# Patient Record
Sex: Male | Born: 1945 | Race: White | Hispanic: No | Marital: Single | Smoking: Never smoker
Health system: Southern US, Community
[De-identification: ages and names within clinical notes are randomized; demographics above are authoritative.]

## PROBLEM LIST (undated history)

## (undated) DIAGNOSIS — E7800 Pure hypercholesterolemia, unspecified: Secondary | ICD-10-CM

## (undated) DIAGNOSIS — I10 Essential (primary) hypertension: Secondary | ICD-10-CM

## (undated) DIAGNOSIS — E785 Hyperlipidemia, unspecified: Secondary | ICD-10-CM

## (undated) DIAGNOSIS — L570 Actinic keratosis: Secondary | ICD-10-CM

## (undated) HISTORY — PX: ABDOMINAL SURGERY: SHX537

## (undated) HISTORY — DX: Actinic keratosis: L57.0

---

## 2012-12-25 LAB — COMPREHENSIVE METABOLIC PANEL
Albumin: 3.6 g/dL (ref 3.4–5.0)
Alkaline Phosphatase: 96 U/L (ref 50–136)
Anion Gap: 7 (ref 7–16)
BUN: 21 mg/dL — ABNORMAL HIGH (ref 7–18)
Bilirubin,Total: 0.4 mg/dL (ref 0.2–1.0)
Calcium, Total: 8.8 mg/dL (ref 8.5–10.1)
Chloride: 106 mmol/L (ref 98–107)
Co2: 25 mmol/L (ref 21–32)
Creatinine: 1.12 mg/dL (ref 0.60–1.30)
EGFR (African American): >60
EGFR (Non-African Amer.): >60
Glucose: 159 mg/dL — ABNORMAL HIGH (ref 65–99)
Osmolality: 282 (ref 275–301)
Potassium: 3.6 mmol/L (ref 3.5–5.1)
SGOT(AST): 23 U/L (ref 15–37)
SGPT (ALT): 23 U/L (ref 12–78)
Sodium: 138 mmol/L (ref 136–145)
Total Protein: 7.1 g/dL (ref 6.4–8.2)

## 2012-12-25 LAB — CBC WITH DIFFERENTIAL/PLATELET
Basophil #: 0 10*3/uL (ref 0.0–0.1)
Basophil %: 0.3 %
Eosinophil #: 0.1 10*3/uL (ref 0.0–0.7)
Eosinophil %: 0.9 %
HCT: 37.9 % — ABNORMAL LOW (ref 40.0–52.0)
HGB: 13.3 g/dL (ref 13.0–18.0)
Lymphocyte #: 2.7 10*3/uL (ref 1.0–3.6)
Lymphocyte %: 24.7 %
MCH: 31.2 pg (ref 26.0–34.0)
MCHC: 35 g/dL (ref 32.0–36.0)
MCV: 89 fL (ref 80–100)
Monocyte #: 0.8 x10 3/mm (ref 0.2–1.0)
Monocyte %: 7 %
Neutrophil #: 7.3 10*3/uL — ABNORMAL HIGH (ref 1.4–6.5)
Neutrophil %: 67.1 %
Platelet: 274 10*3/uL (ref 150–440)
RBC: 4.24 10*6/uL — ABNORMAL LOW (ref 4.40–5.90)
RDW: 12.7 % (ref 11.5–14.5)
WBC: 10.9 10*3/uL — ABNORMAL HIGH (ref 3.8–10.6)

## 2012-12-25 LAB — CK TOTAL AND CKMB (NOT AT ARMC)
CK, Total: 77 U/L (ref 35–232)
CK-MB: 0.6 ng/mL (ref 0.5–3.6)

## 2012-12-25 LAB — LIPASE, BLOOD: Lipase: 166 U/L (ref 73–393)

## 2012-12-25 LAB — PROTIME-INR
INR: 1
Prothrombin Time: 13.5 secs (ref 11.5–14.7)

## 2012-12-26 ENCOUNTER — Inpatient Hospital Stay: Payer: Self-pay | Admitting: Internal Medicine

## 2012-12-26 LAB — CBC WITH DIFFERENTIAL/PLATELET
Basophil #: 0 x10 3/mm 3
Basophil %: 0.1 %
Eosinophil #: 0 x10 3/mm 3
Eosinophil %: 0 %
HCT: 28.2 % — ABNORMAL LOW
HGB: 9.9 g/dL — ABNORMAL LOW
Lymphocyte %: 15.7 %
Lymphs Abs: 1.6 x10 3/mm 3
MCH: 31.2 pg
MCHC: 35 g/dL
MCV: 89 fL
Monocyte #: 0.7 "x10 3/mm "
Monocyte %: 6.9 %
Neutrophil #: 7.7 x10 3/mm 3 — ABNORMAL HIGH
Neutrophil %: 77.3 %
Platelet: 161 x10 3/mm 3
RBC: 3.17 x10 6/mm 3 — ABNORMAL LOW
RDW: 13.8 %
WBC: 9.9 x10 3/mm 3

## 2012-12-26 LAB — URINALYSIS, COMPLETE
Bacteria: NONE SEEN
Bilirubin,UR: NEGATIVE
Blood: NEGATIVE
Glucose,UR: NEGATIVE mg/dL (ref 0–75)
Leukocyte Esterase: NEGATIVE
Nitrite: NEGATIVE
Ph: 5 (ref 4.5–8.0)
Protein: NEGATIVE
RBC,UR: 1 /HPF (ref 0–5)
Specific Gravity: 1.039 (ref 1.003–1.030)
Squamous Epithelial: NONE SEEN
WBC UR: 1 /HPF (ref 0–5)

## 2012-12-26 LAB — BASIC METABOLIC PANEL
Anion Gap: 5 — ABNORMAL LOW (ref 7–16)
BUN: 21 mg/dL — ABNORMAL HIGH (ref 7–18)
Calcium, Total: 7.4 mg/dL — ABNORMAL LOW (ref 8.5–10.1)
Chloride: 113 mmol/L — ABNORMAL HIGH (ref 98–107)
Co2: 23 mmol/L (ref 21–32)
Creatinine: 0.9 mg/dL (ref 0.60–1.30)
EGFR (African American): >60
EGFR (Non-African Amer.): >60
Glucose: 134 mg/dL — ABNORMAL HIGH (ref 65–99)
Osmolality: 286 (ref 275–301)
Potassium: 3.9 mmol/L (ref 3.5–5.1)
Sodium: 141 mmol/L (ref 136–145)

## 2012-12-26 LAB — HEMOGLOBIN
HGB: 9.2 g/dL — ABNORMAL LOW (ref 13.0–18.0)
HGB: 9 g/dL — ABNORMAL LOW (ref 13.0–18.0)

## 2012-12-27 LAB — CBC WITH DIFFERENTIAL/PLATELET
Basophil #: 0 10*3/uL (ref 0.0–0.1)
Basophil #: 0 10*3/uL (ref 0.0–0.1)
Basophil %: 0.2 %
Basophil %: 0.3 %
Eosinophil #: 0.1 10*3/uL (ref 0.0–0.7)
Eosinophil #: 0.1 10*3/uL (ref 0.0–0.7)
Eosinophil %: 0.8 %
Eosinophil %: 1.2 %
HCT: 21.2 % — ABNORMAL LOW (ref 40.0–52.0)
HCT: 23.3 % — ABNORMAL LOW (ref 40.0–52.0)
HGB: 7.5 g/dL — ABNORMAL LOW (ref 13.0–18.0)
HGB: 8.3 g/dL — ABNORMAL LOW (ref 13.0–18.0)
Lymphocyte #: 1.3 10*3/uL (ref 1.0–3.6)
Lymphocyte #: 1.7 10*3/uL (ref 1.0–3.6)
Lymphocyte %: 16.5 %
Lymphocyte %: 20 %
MCH: 31 pg (ref 26.0–34.0)
MCH: 31.6 pg (ref 26.0–34.0)
MCHC: 35.2 g/dL (ref 32.0–36.0)
MCHC: 35.8 g/dL (ref 32.0–36.0)
MCV: 88 fL (ref 80–100)
MCV: 88 fL (ref 80–100)
Monocyte #: 0.6 x10 3/mm (ref 0.2–1.0)
Monocyte #: 0.6 x10 3/mm (ref 0.2–1.0)
Monocyte %: 8.1 %
Monocyte %: 7.6 %
Neutrophil #: 5.7 10*3/uL (ref 1.4–6.5)
Neutrophil #: 6 10*3/uL (ref 1.4–6.5)
Neutrophil %: 74.4 %
Neutrophil %: 70.9 %
Platelet: 140 10*3/uL — ABNORMAL LOW (ref 150–440)
Platelet: 140 10*3/uL — ABNORMAL LOW (ref 150–440)
RBC: 2.41 10*6/uL — ABNORMAL LOW (ref 4.40–5.90)
RBC: 2.64 10*6/uL — ABNORMAL LOW (ref 4.40–5.90)
RDW: 13.5 % (ref 11.5–14.5)
RDW: 13.4 % (ref 11.5–14.5)
WBC: 7.6 10*3/uL (ref 3.8–10.6)
WBC: 8.5 10*3/uL (ref 3.8–10.6)

## 2012-12-27 LAB — BASIC METABOLIC PANEL
Anion Gap: 5 — ABNORMAL LOW (ref 7–16)
BUN: 11 mg/dL (ref 7–18)
Calcium, Total: 7.9 mg/dL — ABNORMAL LOW (ref 8.5–10.1)
Chloride: 113 mmol/L — ABNORMAL HIGH (ref 98–107)
Co2: 27 mmol/L (ref 21–32)
Creatinine: 0.81 mg/dL (ref 0.60–1.30)
EGFR (African American): >60
EGFR (Non-African Amer.): >60
Glucose: 109 mg/dL — ABNORMAL HIGH (ref 65–99)
Osmolality: 289 (ref 275–301)
Potassium: 3.4 mmol/L — ABNORMAL LOW (ref 3.5–5.1)
Sodium: 145 mmol/L (ref 136–145)

## 2012-12-28 LAB — CBC WITH DIFFERENTIAL/PLATELET
Basophil #: 0 10*3/uL (ref 0.0–0.1)
Basophil #: 0 10*3/uL (ref 0.0–0.1)
Basophil %: 0.5 %
Basophil %: 0.4 %
Eosinophil #: 0.1 10*3/uL (ref 0.0–0.7)
Eosinophil #: 0.1 10*3/uL (ref 0.0–0.7)
Eosinophil %: 1.6 %
Eosinophil %: 2 %
HCT: 22.7 % — ABNORMAL LOW (ref 40.0–52.0)
HCT: 22.4 % — ABNORMAL LOW (ref 40.0–52.0)
HGB: 8.1 g/dL — ABNORMAL LOW (ref 13.0–18.0)
HGB: 8 g/dL — ABNORMAL LOW (ref 13.0–18.0)
Lymphocyte #: 1.3 10*3/uL (ref 1.0–3.6)
Lymphocyte #: 1.3 10*3/uL (ref 1.0–3.6)
Lymphocyte %: 20.3 %
Lymphocyte %: 19.1 %
MCH: 31.5 pg (ref 26.0–34.0)
MCH: 31.6 pg (ref 26.0–34.0)
MCHC: 35.6 g/dL (ref 32.0–36.0)
MCHC: 35.7 g/dL (ref 32.0–36.0)
MCV: 88 fL (ref 80–100)
MCV: 89 fL (ref 80–100)
Monocyte #: 0.6 x10 3/mm (ref 0.2–1.0)
Monocyte #: 0.6 x10 3/mm (ref 0.2–1.0)
Monocyte %: 8.8 %
Monocyte %: 8.5 %
Neutrophil #: 4.5 10*3/uL (ref 1.4–6.5)
Neutrophil #: 4.9 10*3/uL (ref 1.4–6.5)
Neutrophil %: 68.8 %
Neutrophil %: 70 %
Platelet: 150 10*3/uL (ref 150–440)
Platelet: 160 10*3/uL (ref 150–440)
RBC: 2.56 10*6/uL — ABNORMAL LOW (ref 4.40–5.90)
RBC: 2.53 10*6/uL — ABNORMAL LOW (ref 4.40–5.90)
RDW: 13.3 % (ref 11.5–14.5)
RDW: 13.6 % (ref 11.5–14.5)
WBC: 6.5 10*3/uL (ref 3.8–10.6)
WBC: 7 10*3/uL (ref 3.8–10.6)

## 2012-12-28 LAB — HEMOGLOBIN: HGB: 8.2 g/dL — ABNORMAL LOW (ref 13.0–18.0)

## 2012-12-30 LAB — COMPREHENSIVE METABOLIC PANEL
Albumin: 3.3 g/dL — ABNORMAL LOW (ref 3.4–5.0)
Alkaline Phosphatase: 88 U/L (ref 50–136)
Anion Gap: 5 — ABNORMAL LOW (ref 7–16)
BUN: 21 mg/dL — ABNORMAL HIGH (ref 7–18)
Bilirubin,Total: 0.3 mg/dL (ref 0.2–1.0)
Calcium, Total: 8.8 mg/dL (ref 8.5–10.1)
Chloride: 107 mmol/L (ref 98–107)
Co2: 28 mmol/L (ref 21–32)
Creatinine: 1 mg/dL (ref 0.60–1.30)
EGFR (African American): >60
EGFR (Non-African Amer.): >60
Glucose: 179 mg/dL — ABNORMAL HIGH (ref 65–99)
Osmolality: 287 (ref 275–301)
Potassium: 3.3 mmol/L — ABNORMAL LOW (ref 3.5–5.1)
SGOT(AST): 23 U/L (ref 15–37)
SGPT (ALT): 29 U/L (ref 12–78)
Sodium: 140 mmol/L (ref 136–145)
Total Protein: 6.5 g/dL (ref 6.4–8.2)

## 2012-12-30 LAB — CBC WITH DIFFERENTIAL/PLATELET
Basophil #: 0 10*3/uL (ref 0.0–0.1)
Basophil %: 0.5 %
Eosinophil #: 0.1 10*3/uL (ref 0.0–0.7)
Eosinophil %: 2 %
HCT: 24.1 % — ABNORMAL LOW (ref 40.0–52.0)
HGB: 8.5 g/dL — ABNORMAL LOW (ref 13.0–18.0)
Lymphocyte #: 1.3 10*3/uL (ref 1.0–3.6)
Lymphocyte %: 21.1 %
MCH: 31.7 pg (ref 26.0–34.0)
MCHC: 35.1 g/dL (ref 32.0–36.0)
MCV: 90 fL (ref 80–100)
Monocyte #: 0.6 x10 3/mm (ref 0.2–1.0)
Monocyte %: 9 %
Neutrophil #: 4.2 10*3/uL (ref 1.4–6.5)
Neutrophil %: 67.4 %
Platelet: 281 10*3/uL (ref 150–440)
RBC: 2.67 10*6/uL — ABNORMAL LOW (ref 4.40–5.90)
RDW: 13.4 % (ref 11.5–14.5)
WBC: 6.2 10*3/uL (ref 3.8–10.6)

## 2012-12-30 LAB — URINALYSIS, COMPLETE
Bacteria: NONE SEEN
Bilirubin,UR: NEGATIVE
Blood: NEGATIVE
Glucose,UR: NEGATIVE mg/dL (ref 0–75)
Hyaline Cast: 2
Ketone: NEGATIVE
Leukocyte Esterase: NEGATIVE
Nitrite: NEGATIVE
Ph: 5 (ref 4.5–8.0)
Protein: 30
RBC,UR: 4 /HPF (ref 0–5)
Specific Gravity: 1.027 (ref 1.003–1.030)
Squamous Epithelial: <1
WBC UR: 3 /HPF (ref 0–5)

## 2012-12-30 LAB — CBC
HCT: 23.9 % — ABNORMAL LOW (ref 40.0–52.0)
HGB: 8.3 g/dL — ABNORMAL LOW (ref 13.0–18.0)
MCH: 31.4 pg (ref 26.0–34.0)
MCHC: 34.9 g/dL (ref 32.0–36.0)
MCV: 90 fL (ref 80–100)
Platelet: 265 10*3/uL (ref 150–440)
RBC: 2.65 10*6/uL — ABNORMAL LOW (ref 4.40–5.90)
RDW: 13.6 % (ref 11.5–14.5)
WBC: 5.7 10*3/uL (ref 3.8–10.6)

## 2012-12-30 LAB — PROTIME-INR
INR: 1.1
Prothrombin Time: 13.9 secs (ref 11.5–14.7)

## 2012-12-31 ENCOUNTER — Observation Stay: Payer: Self-pay | Admitting: Student

## 2012-12-31 LAB — HEMOGLOBIN
HGB: 7.9 g/dL — ABNORMAL LOW (ref 13.0–18.0)
HGB: 8 g/dL — ABNORMAL LOW (ref 13.0–18.0)
HGB: 8.7 g/dL — ABNORMAL LOW (ref 13.0–18.0)

## 2013-01-01 LAB — HEMOGLOBIN
HGB: 8.5 g/dL — ABNORMAL LOW (ref 13.0–18.0)
HGB: 8.5 g/dL — ABNORMAL LOW (ref 13.0–18.0)

## 2014-03-27 ENCOUNTER — Ambulatory Visit: Payer: Self-pay | Admitting: Family Medicine

## 2014-06-02 NOTE — Consult Note (Signed)
Chief Complaint:  Subjective/Chief Complaint please see full GI consult and brief consult note. 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  Patietn had a bm wednesday follow2ed by another that was bloody, not nearly as much as that noted on the initial episode last week.  No abdominal pain, no n/v no hematemesis.  No repeat bm since wednesday evening.  Currently on clear liquids, tolerating these.  Denies any previous h/o pudz or other rectal bleeding.  Hemodynamically stable, hgb done shortly ago 8.7, increasing.  Recommend continue clears for now, if stable without bleeding overnight advance to full liquid tomorrow am, continue that for several days before low residue for about a week.  There is a possibility that the bleeding yesterday was actually from anal outlet, eg. internal hemorrhoid rather than recurrent of original site.  HGB stable from d/c value.  Will follow.   VITAL SIGNS/ANCILLARY NOTES: **Vital Signs.:   21-Nov-14 13:38  Vital Signs Type Routine  Celsius 36.3  Temperature Source oral  Pulse Pulse 64  Respirations Respirations 18  Systolic BP Systolic BP 235  Diastolic BP (mmHg) Diastolic BP (mmHg) 74  Mean BP 89  Pulse Ox % Pulse Ox % 100  Pulse Ox Activity Level  At rest  Oxygen Delivery Room Air/ 21 %   Brief Assessment:  Cardiac Regular   Respiratory clear BS   Gastrointestinal details normal Soft  Nontender  Nondistended  No masses palpable  Bowel sounds normal   Lab Results: Routine Hem:  20-Nov-14 15:09   Hemoglobin (CBC)  8.3    20:28   Hemoglobin (CBC)  8.5  21-Nov-14 04:56   Hemoglobin (CBC)  7.9 (Result(s) reported on 31 Dec 2012 at 05:29AM.)    10:22   Hemoglobin (CBC)  8.0 (Result(s) reported on 31 Dec 2012 at 10:33AM.)    17:55   Hemoglobin (CBC)  8.7 (Result(s) reported on 31 Dec 2012 at 06:16PM.)   Assessment/Plan:  Assessment/Plan:  Assessment 1) as noted above.  following.   Electronic Signatures: Loistine Simas (MD)  (Signed 21-Nov-14 19:14)  Authored: Chief Complaint, VITAL SIGNS/ANCILLARY NOTES, Brief Assessment, Lab Results, Assessment/Plan   Last Updated: 21-Nov-14 19:14 by Loistine Simas (MD)

## 2014-06-02 NOTE — H&P (Signed)
PATIENT NAME:  Cody Herrera, Cody Herrera MR#:  [id number] DATE OF BIRTH:  1945-07-02  DATE OF ADMISSION:  12/30/2012   REFERRING PHYSICIAN: Dr. Cheri Guppy.   PRIMARY CARE PHYSICIAN: Dr. Jeananne Rama.   CHIEF COMPLAINT: Bright red blood per rectum.   HISTORY OF PRESENT ILLNESS: This is a 69 year old Caucasian gentleman with past medical history of hypertension, coronary artery disease status post PCI and stenting back in 2008, as well as a recent GI bleed, who is presenting with bright red blood per rectum. The patient states earlier this morning he had a normal bowel movement followed by later in the day, a bowel movement which he described as dark stool followed by bright red blood on the tissue paper after wiping. This is the only episode today. He denies any associated chest pain, palpitations, lightheadedness, shortness of breath. Given the concern of his recent GI bleed requiring 3 units of packed red blood cell transfusion and SMA embolization, he presented to the Emergency Department for further workup and evaluation. Thus far, in the Emergency Department, he has had no further episodes of bleeding. His hemoglobin and hematocrit have been stable as well as his vital signs.  On rectal examination, he is having bright red blood. Currently, he is without complaints.    REVIEW OF SYSTEMS:  CONSTITUTIONAL: Denies fever, fatigue, weakness, pain.  EYES: Denies blurred vision, double vision, eye pain.  EARS, NOSE, THROAT: Denies tinnitus, ear pain, hearing loss.  RESPIRATORY: Denies cough, wheeze, shortness of breath. CARDIOVASCULAR: Denies chest pain, palpitations, edema.  GASTROINTESTINAL: Denies nausea, vomiting, diarrhea, abdominal pain. Positive for bright red blood per rectum.  GENITOURINARY: Denies dysuria, hematuria.  ENDOCRINE: Denies nocturia or polyuria.  HEMATOLOGIC AND LYMPHATIC:  Denies easy bruising or bleeding, aside from rectal bleeding as mentioned above.  SKIN: Denies any rash or lesion.    MUSCULOSKELETAL: Denies any pain in neck, back, shoulder, knees, hips.  NEUROLOGIC: Denies any paralysis, paresthesias.  PSYCHIATRIC: Denies any anxiety or depressive symptoms.  Otherwise, full review of systems performed by me is negative.   PAST MEDICAL HISTORY: Hypertension, coronary artery disease status post PCI and stenting back in 2008, hyperlipidemia, recent GI bleed which required embolization of the SMA. He required 3 units packed red blood cell transfusion during his recent hospital stay.   SOCIAL HISTORY: Denies alcohol, tobacco or drug usage.   FAMILY HISTORY: Positive for hypertension and coronary artery disease.   ALLERGIES: No known drug allergies.   HOME MEDICATIONS: Include atenolol 100 mg p.o. at bedtime, gemfibrozil 600 mg p.o. b.i.d., lisinopril 10 mg p.o. at bedtime, simvastatin 40 mg p.o. at bedtime.   PHYSICAL EXAMINATION: VITAL SIGNS: Temperature 97, heart rate 96, respirations 16, blood pressure 137/76, saturating 100% on room air. Weight 86.2 kg, BMI 28.9.  GENERAL: Well-nourished, well-developed Caucasian gentleman, who is currently in no acute distress.  HEAD: Normocephalic, atraumatic.  EYES:  Pupils equal, round and reactive to light. Extraocular muscles intact. No scleral icterus.  MOUTH: Moist mucous membranes. Dentition intact. No abscess noted.  EARS, NOSE AND THROAT: Throat clear without exudates. No external lesions.  NECK: Supple. No thyromegaly. No nodules. No JVD.  PULMONARY: Clear to auscultation bilaterally. No wheezes, rubs or rhonchi, no use of accessory muscles.  CHEST: Nontender to palpation. Good respiratory effort.  CARDIOVASCULAR: S1, S2, regular rate and rhythm. No murmurs, rubs or gallops. No edema. Pedal pulses 2+ bilaterally.  GASTROINTESTINAL: Soft, nontender, nondistended. No masses. Positive bowel sounds. No hepatosplenomegaly. Rectal exam performed by the Emergency Department revealed frank  blood.  MUSCULOSKELETAL: No swelling,  clubbing or edema. Range of motion full in all extremities.   NEUROLOGIC: Cranial nerves II through XII intact. No gross neurological deficits. Sensation intact. Reflexes intact.  SKIN: No ulcerations, lesions, rashes, cyanosis. Skin warm, dry, turgor is intact. PSYCHIATRIC: Mood and affect; the patient appears anxious and agitated. He is awake, alert, oriented x 3. Insight and judgment intact.   LABORATORY DATA: Sodium 140, potassium 3.3, chloride 107, bicarb 28, BUN 21, creatinine of 1, glucose 179, total protein 6.5, albumin 3.3. Remainder of LFTs within normal limits. WBC 6.2, hemoglobin 8.5, hematocrit 24.1. both of these numbers were actually increased from his discharge, platelets of 281, INR 1.1.   ASSESSMENT AND PLAN: A 69 year old Caucasian gentleman with past history of hypertension, coronary artery disease and recent gastrointestinal bleed requiring 3 units packed red blood cell transfusion, embolization of the SMA who is presenting with bright red blood per rectum. 1.  Bright red blood per rectum. He has recently had gastrointestinal bleed status post  embolization, has not yet restarted his aspirin and Plavix has been discontinued. He has had episode of bright red blood in the last 24 hours with a stable hemoglobin, stable hematocrit and vital signs. We will trend hemoglobin q.6 hours, transfuse as required to keep hemoglobin greater than 7. We will consult GI to see if any further interventions are required. 2.    Hypertension. Continue lisinopril and Atenolol.  3.  Coronary artery disease.  Status post stenting and congestive heart illness requires. She is status post stenting, status post PCI. Have yet to restart start aspirin. We will continue to hold for now. 4.   Hyperlipidemia. Continue Zocor and gemfibrozil.  5.   Deep venous prophylaxis, sequential compression devices only.  6.   CODE STATUS: The patient is full code.   TIME SPENT: 45 minutes.      ____________________________ Aaron Mose. Kasy Iannacone, MD dkh:NTS D: 12/30/2012 22:55:03 ET T: 12/30/2012 23:20:41 ET JOB#: [id number]  cc: Aaron Mose. Nekesha Font, MD, <Dictator> Keerthana Vanrossum Woodfin Ganja MD ELECTRONICALLY SIGNED 12/31/2012 23:43

## 2014-06-02 NOTE — Discharge Summary (Signed)
PATIENT NAME:  Cody Herrera, FOSDICK MR#:  [id number] DATE OF BIRTH:  Jul 29, 1945  DATE OF ADMISSION:  12/31/2012 DATE OF DISCHARGE:  01/01/2013  CONSULTANTS: Dr. Gustavo Lah from GI.   CHIEF COMPLAINT: Bright red blood per rectum.   DISCHARGE DIAGNOSES:  1.  Lower gastrointestinal bleed, possibly diverticular, resolved.  2,  Anemia suspected from gastrointestinal bleed.  3.  History of coronary artery disease, status post stenting in the past.  4 . Hypertension.  5.  Hyperlipidemia.  6.  History of recent hospitalization for diverticular, bleed status post embolization  requiring packed red blood cell transfusion.   DISCHARGE MEDICATIONS: Simvastatin 40 mg daily, atenolol 100 mg daily, lisinopril 10 mg daily, gemfibrozil 600 mg 2 times a day. You may resume your aspirin the next couple of days if no more bleeding.   DIET: Full liquid tomorrow and then GI soft diet with low residue after that.   FOLLOWUP: Please follow with Dr. Gustavo Lah within the next 1 to 2 weeks. Please follow with PCP within the next 1 to 2 weeks. Check CBC early next week.   DISPOSITION: Home.   SIGNIFICANT LABS AND IMAGING: Initial BUN 21, creatinine 1, potassium 3.3, hemoglobin 8.3. Hemoglobin was stable mostly in the 8s, last hemoglobin was 8.5, INR 1.1.   HISTORY OF PRESENT ILLNESS AND HOSPITAL COURSE: For full details of H and P, please see the dictation by Dr. Lavetta Nielsen on 11/20, but briefly this is a pleasant 69 year old with CAD, status post PCI in 2008 or so with history of recent gastrointestinal bleed, who underwent embolization. At that time, the patient required PRBC transfusion. He was discharged, but came back after having an episode of bright red blood per rectum without any chest pain, palpitations, or lightheadedness. He had no further no bleeding. He was seen by Dr. Gustavo Lah from GI. His hemoglobin was serially checked and it remained stable in the 8s. His diet was advanced. Per Dr. Gustavo Lah, he is to follow  with him in the next 1 to 2 weeks for outpatient followup. The patient was very eager to be discharged. At this point, he is tolerating diet and seems hemodynamically stable, and he will be discharged. His acute gastrointestinal bleed has resolved and his hemoglobin is about the same level as previous discharge.   TOTAL TIME SPENT: 35 minutes.  ____________________________ Vivien Presto, MD sa:aw D: 01/02/2013 08:23:19 ET T: 01/02/2013 08:28:40 ET JOB#: [id number]  cc: Vivien Presto, MD, <Dictator> Vivien Presto MD ELECTRONICALLY SIGNED 01/15/2013 19:11

## 2014-06-02 NOTE — Consult Note (Signed)
Brief Consult Note: Diagnosis: rectal bleeding.   Patient was seen by consultant.   Consult note dictated.   Comments: Appreciate consult of for 69 y/o caucasian man with history of recent hepatic flexure bleed earlier this week. Underwent successful embolization by Dr Delana Meyer. Was also evaluated by Dr Rayann Heman of GI - did seem to have some rectal bleeding after procedure and was to possibly have colonoscopy yesterday if it continued. However, patient was discharged on Wed. Evidentally was not having any further bleeding: until after he went to [employer] yesterday- had a loosish dark brown (but not black) stool with some brpr afterwards. Came to ED. Was found to have brpr on exam and subsequently admitted. Denies abdominal pain and all other GI complaints. He is very worried about the bleeding. On exam his abdomen is benign. Rectal exam revealed a large external hemorrhoid and soft areas in the anal ring consistent with internal hemorrhoids. There was a very slight amount of pink material on my gloved finger after exam.  His hgb has been stable and he is hemodynamically stable. Impression and plan: BRPR: may be anal outlet, to me this does not appear consistent with bleeding consistent from higher in the colon unless at rapid pace and then would expect his hgb to be dropping. It is Improving- only with trace pink material on exam. No bloody stools since yesterday. Hgb stable. Currently recommend CLIQ diet and continuing to follow hemoglobins overnight if patient will agree to stay overnight..  Electronic Signatures: Stephens November H (NP)  (Signed 21-Nov-14 13:58)  Authored: Brief Consult Note   Last Updated: 21-Nov-14 13:58 by Theodore Demark (NP)

## 2014-06-02 NOTE — Consult Note (Signed)
General Aspect GI bleed with hypotension   Present Illness 69 yo male who presented to the ER tonight after two hours of rectal bleeding.  In the ER he is reported to have have 1/2 dozen more bloody bowel movements and subsequently became hypotensive.  Bleeding scan was done which was strongly positive for a hepatic flexure bleed.  No past episodes of GI bleeding, no previous diagnosis of colon pathology.  No recent illness    No Known Allergies:   Case History:  Family History Non-Contributory   Social History negative tobacco, negative ETOH, negative Illicit drugs   Review of Systems:  Fever/Chills No   Cough No   Sputum No   Abdominal Pain No   Diarrhea Yes  bloody   Constipation No   Nausea/Vomiting Yes  mild nausea   Chest Pain No   Telemetry Reviewed NSR   Dysuria No   Physical Exam:  GEN well developed, well nourished, obese   HEENT pale conjunctivae, PERRL, hearing intact to voice, dry oral mucosa   NECK supple  trachea midline   RESP normal resp effort  no use of accessory muscles   CARD regular rate  no JVD   ABD soft  distended  hyperactive BS   EXTR negative cyanosis/clubbing, negative edema   SKIN normal to palpation, No rashes   NEURO cranial nerves intact, follows commands, motor/sensory function intact   PSYCH alert, A+O to time, place, person, good insight   Hepatic:  15-Nov-14 16:10   Bilirubin, Total 0.4  Alkaline Phosphatase 96  SGPT (ALT) 23  SGOT (AST) 23  Total Protein, Serum 7.1  Albumin, Serum 3.6  Routine BB:  15-Nov-14 16:40   ABO Group + Rh Type O Positive  Antibody Screen NEGATIVE (Result(s) reported on 25 Dec 2012 at 05:42PM.)  Crossmatch Unit 1 Issued  Crossmatch Unit 2 Issued (Result(s) reported on 25 Dec 2012 at 10:10PM.)  Routine Chem:  15-Nov-14 16:10   Glucose, Serum  159  BUN  21  Creatinine (comp) 1.12  Sodium, Serum 138  Potassium, Serum 3.6  Chloride, Serum 106  CO2, Serum 25  Calcium (Total),  Serum 8.8  Osmolality (calc) 282  eGFR (African American) >60  eGFR (Non-African American) >60 (eGFR values <57m/min/1.73 m2 may be an indication of chronic kidney disease (CKD). Calculated eGFR is useful in patients with stable renal function. The eGFR calculation will not be reliable in acutely ill patients when serum creatinine is changing rapidly. It is not useful in  patients on dialysis. The eGFR calculation may not be applicable to patients at the low and high extremes of body sizes, pregnant women, and vegetarians.)  Anion Gap 7  Lipase 166 (Result(s) reported on 25 Dec 2012 at 04:58PM.)  Cardiac:  15-Nov-14 16:10   CK, Total 77  CPK-MB, Serum 0.6 (Result(s) reported on 25 Dec 2012 at 04:58PM.)  Routine Coag:  15-Nov-14 16:10   Prothrombin 13.5  INR 1.0 (INR reference interval applies to patients on anticoagulant therapy. A single INR therapeutic range for coumarins is not optimal for all indications; however, the suggested range for most indications is 2.0 - 3.0. Exceptions to the INR Reference Range may include: Prosthetic heart valves, acute myocardial infarction, prevention of myocardial infarction, and combinations of aspirin and anticoagulant. The need for a higher or lower target INR must be assessed individually. Reference: The Pharmacology and Management of the Vitamin K  antagonists: the seventh ACCP Conference on Antithrombotic and Thrombolytic Therapy. CCWCBJ.6283Sept:126 (3suppl): 2N9146842 A  HCT value >55% may artifactually increase the PT.  In one study,  the increase was an average of 25%. Reference:  "Effect on Routine and Special Coagulation Testing Values of Citrate Anticoagulant Adjustment in Patients with High HCT Values." American Journal of Clinical Pathology 2006;126:400-405.)  Routine Hem:  15-Nov-14 16:10   WBC (CBC)  10.9  RBC (CBC)  4.24  Hemoglobin (CBC) 13.3  Hematocrit (CBC)  37.9  Platelet Count (CBC) 274  MCV 89  MCH 31.2  MCHC  35.0  RDW 12.7  Neutrophil % 67.1  Lymphocyte % 24.7  Monocyte % 7.0  Eosinophil % 0.9  Basophil % 0.3  Neutrophil #  7.3  Lymphocyte # 2.7  Monocyte # 0.8  Eosinophil # 0.1  Basophil # 0.0 (Result(s) reported on 25 Dec 2012 at 04:46PM.)   Nuclear Med:    15-Nov-14 21:30, GI Blood Loss Study - Nuc Med  GI Blood Loss Study - Nuc Med   REASON FOR EXAM:    acute GI bleed - large blood loss, syncope  COMMENTS:       PROCEDURE: NM  - NM GI BLOOD LOSS STUDY  - Dec 25 2012  9:30PM     CLINICAL DATA:  Bright red blood per rectum. , actively bleeding per  rectum.    EXAM:  NUCLEAR MEDICINE GASTROINTESTINAL BLEEDING SCAN    TECHNIQUE:  Sequential abdominal images were obtained following intravenous  administration of Tc-25mlabeled red blood cells.  COMPARISON:  None    RADIOPHARMACEUTICALS:  24.44m Tc-995m-vitro labeled red cells.    FINDINGS:  Almost immediately (within the 1st 10 min), there is evidence of  active bleeding in the right upper quadrant. This intraluminal tag  red blood cell bolus moves towards the right upper quadrant and a  large caliber lumen consistent with the transverse colon. The  patient was taken off the camera for bloody bowel movement. Imaging  was renewed and bleeding again initiated in the region of the  hepatic flexure and moved towards the splenic flexure.     IMPRESSION:  Active gastrointestinal bleeding localized to the hepatic flexure of  the transverse colon.    Findings conveyed toDAVID KAMINSKI on 12/25/2012  at22:00.      Electronically Signed    By: SteSuzy BouchardD.    On: 12/25/2012 22:02         Verified By: JOHHoward Pouch   Impression 1.  Acute GI bleed with strongly positive bleeding scan and subsequent hypotension          Medicine to admit          GI on consult states will plan colonoscopy down the road           will embolize tonight; risks benefits and alternative discussed with patient and daughter.   All questions answered.  Patietn agrees to proceed 2.  CAD s/p multiple coronary stents.            home meds per medical service            consider cardiology consult   Plan level 3   Electronic Signatures: SchHortencia PilarD)  (Signed [id number])  Authored: General Aspect/Present Illness, Allergies, History and Physical Exam, Labs, Radiology, Impression/Plan   Last Updated: 15-Nov-14 23:34 by SchHortencia PilarD)

## 2014-06-02 NOTE — Consult Note (Signed)
PATIENT NAME:  Cody Herrera, Cody Herrera MR#:  [id number] DATE OF BIRTH:  1945/11/15  DATE OF CONSULTATION:  12/26/2012  PRIMARY CARE PHYSICIAN: Dr. Golden Pop,  REFERRING PHYSICIAN: Dr. Carolan Clines PHYSICIAN: Arther Dames, M.D.   REASON FOR THE CONSULT: Lower gastrointestinal bleed.   HISTORY OF THE PRESENT ILLNESS: Cody Herrera is a 69 year old male with a history of coronary disease, status post multiple stents on Plavix, who presented to the Emergency Room yesterday afternoon for acute onset of rectal bleeding. He had multiple episodes of bright red blood per rectum. He describes this as a significant amount of blood. He then presented to the Emergency Room and continued to have several more episodes of hematochezia. He was noted to be hypotensive in the Emergency Room and received blood and fluid. He also went for a tagged RBC scan, which showed evidence of active bleeding in the hepatic flexure. Based on this, he then went for a successful angiography with embolization.   Since the procedure he has not had any further bleeding. He has remained hemodynamically stable. In addition, he has not had a bowel movement since the Emergency Room.   He denies any previous episodes of GI bleed. He does report a colonoscopy several years ago. He thinks this was done at [facility] but is not sure. Does not know the findings on that. He has never had an upper endoscopy that he is aware of. He is not having any trouble with chest pain or shortness of breath.   PAST MEDICAL HISTORY: 1. Hypertension.  2. Hyperlipidemia.  3. Coronary artery disease, status post stenting on Plavix.   PAST SURGICAL HISTORY: None.   ALLERGIES: NKDA.   HOME MEDICATIONS: The list is currently unknown, although it is known that he does take Plavix.   SOCIAL HISTORY: He denies tobacco, alcohol or recreational drugs.   FAMILY HISTORY: He denies any family history of colon cancer or other GI malignancy.   REVIEW OF  SYSTEMS:   CONSTITUTIONAL: No weight gain or weight loss.  No fever or chills. HEENT: No oral lesions or sore throat. No vision changes. GASTROINTESTINAL: See HPI.  HEME/LYMPH: No easy bruising or bleeding. CARDIOVASCULAR: No chest pain or dyspnea on exertion. GENITOURINARY: No hematuria. INTEGUMENTARY: No rashes or pruritus PSYCHIATRIC: No depression/anxiety.  ENDOCRINE: No heat/cold intolerance, no hair loss or skin changes. ALLERGIC/IMMUNOLOGIC: Negative for hives. RESPIRATORY: No cough, no shortness of breath.  MUSCULOSKELETAL: No joint swelling or muscle pain.  PHYSICAL EXAMINATION: VITAL SIGNS: Temperature is 98.7, pulse is 80. Respirations are 17. Blood pressure 137/70, pulse oximetry is 96% on room air.  GENERAL: Alert and oriented times 4.  No acute distress. Appears stated age. HEENT: Normocephalic/atraumatic. Extraocular movements are intact. Anicteric. NECK: Soft, supple. JVP appears normal. No adenopathy. CHEST: Clear to auscultation. No wheeze or crackle. Respirations unlabored. HEART: Regular. No murmur, rub, or gallop.  Normal S1 and S2. ABDOMEN: Soft, nontender, nondistended.  Normal active bowel sounds in all four quadrants.  No organomegaly. No masses EXTREMITIES: No swelling, well perfused. SKIN: No rash or lesion. Skin color, texture, turgor normal. NEUROLOGICAL: Grossly intact. PSYCHIATRIC: Normal tone and affect. MUSCULOSKELETAL: No joint swelling or erythema.   LABORATORY DATA: His sodium is under 41, potassium 3.9, BUN 21, creatinine 0.9, lipase is normal. Liver enzymes are normal, albumin 3.6. Cardiac enzymes normal. White count 9.9. Current hemoglobin is 9.9, which is down from 13.3 yesterday. His platelets are 161. His INR is 1.0.  For the results of the tag red blood cell  scan in angiography. Please see the history of present illness.   ASSESSMENT AND PLAN: Lower gastrointestinal bleed: He did have a positive tag scan and underwent successful angiography  with embolization. Most likely this was a diverticular bleed but the etiology is unclear at this point.   He seems that he has not had any further bleeding. No bowel movements and hemodynamic changes.   PLAN:  1. He should have a colonoscopy as an outpatient in approximately six weeks to rule out another cause of the GI bleeding such as malignancy. We will set this up for Cody Herrera.  2. He is currently doing well. Even though the Plavix was stopped it is certainly still active in his system. Therefore, since he is doing okay with Plavix in his system I do not see a problem with restarting his Plavix. I would wait until another 24 hours to make sure his hemoglobin is stable and that he does not have any further bleeding. At that point it should be fine to restart his Plavix.   Thank you for this consult.   ____________________________ Arther Dames, MD mr:sg D: 12/26/2012 12:27:59 ET T: 12/26/2012 14:08:59 ET JOB#: [id number]  cc: Arther Dames, MD, <Dictator>  Mellody Life MD ELECTRONICALLY SIGNED 12/26/2012 19:47

## 2014-06-02 NOTE — Op Note (Signed)
PATIENT NAME:  Cody Herrera, Cody Herrera MR#:  [id number] DATE OF BIRTH:  20-Aug-1945  DATE OF PROCEDURE:  12/26/2012  PREOPERATIVE DIAGNOSIS: Gastrointestinal bleed.   POSTOPERATIVE DIAGNOSIS:  Gastrointestinal bleed.   PROCEDURE PERFORMED:   1.  Selective injection of the superior mesenteric artery.  2.  Embolization of mesenteric branches using 300 to 500 micron PVC beads.   SURGEON: Hortencia Pilar, M.D.   SEDATION: Versed 5 mg, plus fentanyl 200 mcg administered IV. Continuous ECG, pulse oximetry and cardiopulmonary monitoring was performed throughout the entire procedure by the interventional radiology nurse. Total sedation time was 1 hour/20 minutes.   ACCESS:  A 5 French sheath, right common femoral artery.   FLUOROSCOPY TIME: 25 minutes.   CONTRAST USED: Isovue 120 mL.   INDICATIONS: Cody Herrera is a 69 year old gentleman who presented to the Emergency Room with GI bleed. While in the Emergency Room, he had multiple bowel movements, became hypotensive. I have been contacted regarding embolization to stop the life-threatening hemorrhage. The patient underwent a bleeding scan which was positive. The risks and benefits, as well as the current therapies were reviewed. Complications including hematoma at the groin site, contrast reactions and nephrotoxicity of the contrast, as well as potential damage and necrosis of colon were all reviewed. All questions were answered. The patient is in agreement with proceeding emergently.   DESCRIPTION OF PROCEDURE: The patient is taken to special procedures and placed in the supine position. After adequate sedation was achieved, both groins are prepped and draped in sterile fashion. Ultrasound was placed in a sterile sleeve. Ultrasound is utilized secondary to lack of appropriate landmarks and to avoid vascular injury. Under direct ultrasound visualization, the common femoral artery is identified. It is echolucent and pulsatile, indicating patency. Image  is recorded for the permanent record, and access is obtained with a micropuncture needle under direct visualization. Microwire followed by micro sheath, J-wire followed by a 5 French sheath, 5 French pigtail catheter. The pigtail catheter is placed at T12 level and AP projection is obtained. This localizes the SMA and a lateral view was then obtained. Once the lateral view is reviewed, origin of the SMA is identified. It comes off at a very sharp angle, instead of slowly angling down as is normal. It comes straight off and then has several sharp angles. This made it somewhat difficult to negotiate with the wire and the catheter. However, once the catheter was seated into the SMA, selective imaging of several branches was obtained. When a blush was identified 300 to 500 micron beads were  infused. total of 3 mL was injected into several different branches. After each injection, contrast was reinjected to demonstrate that there was adequate persistent flow. Catheter was subsequently removed and oblique view of the right groin was obtained and a StarClose device deployed. There were no immediate complications.   INTERPRETATION: The patient's SMA is widely patent. It is somewhat difficult to negotiate with catheters and wires, as it has very sharp angles and, in fact, several of the branches had almost 360 degree loop or  very sharp hairpin turns. Ultimately, however, a successful embolization was achieved.     ____________________________ Katha Cabal, MD ggs:NTS D: 12/26/2012 01:17:18 ET T: 12/26/2012 01:48:33 ET JOB#: [id number]  cc: Katha Cabal, MD, <Dictator> Katha Cabal MD ELECTRONICALLY SIGNED 01/17/2013 17:16

## 2014-06-02 NOTE — Consult Note (Signed)
Brief Consult Note: Diagnosis: Pt admitted with gi bleed on asa and clopidigrel.   Patient was seen by consultant.   Comments: Pt with history of cad s/p pci of al1 with  taxus stent in 2008, and Xience stent in 2011 at [facility]. Has been on clopidigrel since then. Had gi bleed. asa and clopidigrel was stopped. Pt would like to remain off of clopidigrel if possible. Based on guidelines, pt may discontinue clopidigrel. Would remain on asa 81 mg daily; atenolol and lisinoprel as well as simvastatin.  Electronic Signatures: Teodoro Spray (MD)  (Signed [phone] 17:01)  Authored: Brief Consult Note   Last Updated: 17-Nov-14 17:01 by Teodoro Spray (MD)

## 2014-06-02 NOTE — Consult Note (Signed)
Chief Complaint:  Subjective/Chief Complaint denies abdominal pain or nausea, tolerating full liquid diet.  one bm this am , rust brown color.   VITAL SIGNS/ANCILLARY NOTES: **Vital Signs.:   22-Nov-14 05:42  Vital Signs Type Routine  Temperature Temperature (F) 98.7  Celsius 37  Pulse Pulse 61  Respirations Respirations 18  Systolic BP Systolic BP 97  Diastolic BP (mmHg) Diastolic BP (mmHg) 57  Mean BP 70  Systolic BP Systolic BP 97  Diastolic BP (mmHg) Diastolic BP (mmHg) 57  Pulse Ox % Pulse Ox % 97  Pulse Ox Activity Level  At rest  Oxygen Delivery Room Air/ 21 %    05:44  Pulse Pulse 65  Systolic BP Systolic BP 594  Diastolic BP (mmHg) Diastolic BP (mmHg) 64    58:59  Pulse Pulse 76  Systolic BP Systolic BP 292  Diastolic BP (mmHg) Diastolic BP (mmHg) 61  *Intake and Output.:   22-Nov-14 09:15  Stool  1, large and formed,   scant amount of blood noted in bottom of toilet   Brief Assessment:  Cardiac Regular   Respiratory clear BS   Gastrointestinal details normal Soft  Nontender  Nondistended  No masses palpable  Bowel sounds normal   Additional Physical Exam DRE-rust colored brown stool, soft no melena.   Lab Results: Routine Hem:  20-Nov-14 15:09   Hemoglobin (CBC)  8.3    20:28   Hemoglobin (CBC)  8.5  21-Nov-14 04:56   Hemoglobin (CBC)  7.9 (Result(s) reported on 31 Dec 2012 at 05:29AM.)    10:22   Hemoglobin (CBC)  8.0 (Result(s) reported on 31 Dec 2012 at 10:33AM.)    17:55   Hemoglobin (CBC)  8.7 (Result(s) reported on 31 Dec 2012 at 06:16PM.)  22-Nov-14 05:00   Hemoglobin (CBC)  8.5 (Result(s) reported on 01 Jan 2013 at 05:26AM.)   Assessment/Plan:  Assessment/Plan:  Assessment 1) hematochezia-stable hemodynamically and via serial labs. source likely diverticular, also s/p microembolization.   Plan 1) continue full liquid diet today and tomorrow, if no repeat melena/ active bleeding can switch to low residue to continue for a week.  2) in  regard to D/C, patietn wishes to go home as soon as possible.  would observe through late afternoon and if no changes, could consider d/c with close fu.  He should return if repeat episode.  Will recheck hgb at 2pm.   Electronic Signatures: Loistine Simas (MD)  (Signed [phone] 12:51)  Authored: Chief Complaint, VITAL SIGNS/ANCILLARY NOTES, Brief Assessment, Lab Results, Assessment/Plan   Last Updated: 22-Nov-14 12:51 by Loistine Simas (MD)

## 2014-06-02 NOTE — H&P (Signed)
PATIENT NAME:  Cody Herrera, Cody Herrera MR#:  [id number] DATE OF BIRTH:  March 26, 1945  DATE OF ADMISSION:  12/26/2012  PRIMARY CARE PHYSICIAN:  Dr. Golden Pop.   REFERRING PHYSICIAN:  Dr. Francene Castle.   CHIEF COMPLAINT:  Gastrointestinal bleed.   HISTORY OF PRESENT ILLNESS:  Mr. Wimer is a 69 year old pleasant white male with a past medical history of hypertension, hyperlipidemia, coronary artery disease, status post multiple stent placement, presented to the Emergency Department with complaints of GI bleed.  It started around 4:00 p.m.  Had 4 to 5 large bloody stools at home.  Concerning this, came to the Emergency Department.  After coming to the Emergency Department, the patient was profusely bleeding.  The patient had another 7 to 8 bowel movements while in the Emergency Department.  The patient became hypotensive.  Initial CBC was 13.3.  Repeat CBC is currently pending.  Coag profile is within normal limits.  The patient underwent tagged RBC scan, showed active GI bleeding localized to the hepatic flexure of the transverse colon.  Vascular surgery was consulted.  The patient underwent embolization with resolution of the bleeding.  The patient is currently admitted to the ICU.  Denies having any abdominal pain, nausea, vomiting.  Did not have any bowel movements in the last two hours.  The patient denies having any previous episodes of GI bleed.  The patient had a previous history of colonoscopy.   PAST MEDICAL HISTORY: 1.  Hypertension.  2.  Hyperlipidemia.  3.  Coronary artery disease, status post multiple stent placement.   PAST SURGICAL HISTORY:  None.   ALLERGIES:  No known drug allergies.   HOME MEDICATIONS:  Currently we do not have the list of medication.   SOCIAL HISTORY:  Denies smoking, drinking alcohol or using illicit drugs.  Retired, lives by himself.  Independent of ADLs and IADLs.   FAMILY HISTORY:  History of hypertension, hyperlipidemia, carotid artery disease.    REVIEW OF SYSTEMS: CONSTITUTIONAL:  Denies generalized weakness.  EYES:  No change in vision.  EARS, NOSE, THROAT:  No change in hearing.  RESPIRATORY:  No cough, shortness of breath.  CARDIOVASCULAR:  No chest pain, palpitations.  No pedal edema.  GASTROINTESTINAL:  Has GI bleed.  There is no abdominal pain, nausea, vomiting.  GENITOURINARY:  No dysuria or hematuria.  SKIN:  No rash or lesions.  HEMATOLOGIC:  No easy bruising or bleeding.  MUSCULOSKELETAL:  No joint pains and aches.  NEUROLOGIC:  No weakness or numbness in any part of the body.   PHYSICAL EXAMINATION: GENERAL:  This is a well-built, well-nourished, age-appropriate male lying down in the bed, not in distress.  VITAL SIGNS:  Temperature 98, pulse 84, blood pressure 96/61, respiratory rate of 16, oxygen saturation is 96% on room air.  HEENT:  Head normocephalic, atraumatic.  Eyes, no scleral icterus.  Conjunctivae normal.  Pupils equal and react to light.  Mucous membranes moist.  No pharyngeal erythema.  NECK:  Supple.  No lymphadenopathy.  No JVD.  No carotid bruit.  No thyromegaly.  CHEST:  Has no focal tenderness.  LUNGS:  Bilaterally clear to auscultation.  HEART:  S1 and S2 regular.  No murmurs are heard.  No pedal edema.  Pulses 2+.  ABDOMEN:  Bowel sounds plus.  Soft, nontender, nondistended.  Could not appreciate any hepatosplenomegaly.  SKIN:  No rash or lesions.  MUSCULOSKELETAL:  Good range of motion in all the extremities.   NEUROLOGIC:  The patient is alert, oriented to place,  person and time.  Cranial nerves II through XII intact.  Motor 5 by 5 in upper and lower extremities.   LABORATORY DATA:  Lipase is 166.  CMP is completely within normal limits.  CBC:  WBC of 10.9, hemoglobin 13.3 initially, at the time of the presentation, platelet count of 274.  Coag profile is well within normal limits.  The patient had tagged RBC scan which is positive for bleed in the transverse colon.   ASSESSMENT AND PLAN:   Mr. Beeks is a 69 year old male who comes to the Emergency Department with profuse gastrointestinal bleed.  1.  Gastrointestinal bleed, lower.  Continue with IV fluids, monitoring CBC q. 6h.  If the patient had any further episodes of bowel movements repeat the CBC.  Transfuse 2 units of packed RBC.  We will keep the patient nothing by mouth until we ensure hemoglobin is stabilized.  We will also have a close watch for any signs of abdominal pain. 2.  Hypotension.  Give 1 liter fluid for this and follow up.  3.  Hypertension.  We do not have a list of his medications, however will be held secondary to hypotension.  4.  History of coronary artery disease.  Holding all the medications for now.   5.  Keep the patient on DVT prophylaxis with SCDs.   TIME SPENT:  50 minutes.    ____________________________ Monica Becton, MD pv:ea D: 12/26/2012 03:31:52 ET T: 12/26/2012 03:55:49 ET JOB#: [id number]  cc: Monica Becton, MD, <Dictator> Guadalupe Maple, MD Monica Becton MD ELECTRONICALLY SIGNED 01/09/2013 0:38

## 2014-06-02 NOTE — Consult Note (Signed)
PATIENT NAME:  Cody Herrera, THAIN MR#:  [id number] DATE OF BIRTH:  [birth year]  DATE OF CONSULTATION:  12/31/2012  REFERRING PHYSICIAN:  Dr. Lavetta Nielsen CONSULTING PHYSICIAN:  Theodore Demark, NP  REASON FOR CONSULTATION:  GI consult was ordered by Dr. Lavetta Nielsen for evaluation of GI bleeding.   HISTORY OF PRESENT ILLNESS:  I appreciate consult for a 69 year old Caucasian man with history of recent hepatic flexure bleed earlier this week, underwent a successful embolization by Dr. Hinton Lovely, was also evaluated by Dr. Rayann Heman of GI, did seem to have some rectal bleeding after procedure and was to possibly have colonoscopy yesterday if it continued. However, the patient was discharged on Wednesday. Evidently he was not having any further rectal bleeding until after he went to [facility] yesterday, had a loose, dark brown, but not black, stool with some bright red-blood per rectum afterwards. He came to the ED, was found to have BRP on exam, was subsequently admitted. His hemoglobin has been stable and he has been hemodynamically stable.   REVIEW OF SYSTEMS:  Ten systems reviewed, unremarkable other than what is noted above.   PAST MEDICAL HISTORY:  Hypertension, CAD status post cardiac stent in 2008, hyperlipidemia. Did require 3 units of packed red blood cells earlier this week.   SOCIAL HISTORY:  No EtOH, tobacco, or drugs.   FAMILY HISTORY:  Significant for CAD, HTN.   ALLERGIES:  NKDA.  HOME MEDICATIONS: 1.  Atenolol 100 mg p.o. at bedtime.  2.  Gemfibrozil 600 mg b.i.d. 3.  Lisinopril 10 mg p.o. at bedtime.  4.  Simvastatin 40 mg p.o. at bedtime.   MOST RECENT LABS:  Glucose 179, BUN 21, creatinine 1.00, sodium 140, potassium 3.3, GFR greater than 60, calcium 8.8, total protein 6.5, albumin 3.3, ALP 88, total bilirubin 2.3, AST 23, ALT 29. WBC 5.7, hemoglobin has ranged from 8.3 to 8.5, 7.9 to currently 8, hematocrit 24.1. Red cells normocytic with normal RDW, platelets 281. PT 13.9, INR 1.1.    PHYSICAL EXAMINATION:  VITAL SIGNS:  Temp 98.1, pulse 75, respiratory rate 19, blood pressure 115/68.  GENERAL:  Well-appearing, well-nourished, Caucasian man in bed in no acute distress.  HEENT:  Normocephalic, atraumatic. Conjunctiva pink. Sclerae are clear.  NECK:  Supple. No JVD, lymphadenopathy, thyromegaly.  CHEST:  Respirations eupneic. Lungs clear.  CARDIAC:  S1, S2, RRR. No MRG. No edema. Peripheral pulses 2+.  GASTROINTESTINAL:  Flat abdomen, soft. Bowel sounds x 4. Nontender, nondistended. No guarding, rigidity, peritoneal signs, hepatosplenomegaly or other abnormalities.  RECTAL:  Large external hemorrhoid. Internal exam with some soft areas consistent with internal hemorrhoids. There was a very slight trace amount of pink material on my finger once the exam is done.  SKIN:  Warm, dry, pink. No erythema, lesion or rash.  EXTREMITIES:  MAEW x 4. Sensation intact.  NEUROLOGIC:  Alert, oriented x 3. Cranial nerves II through XII intact. Speech clear. No facial droop.   IMPRESSION AND PLAN:  Bright-red blood per rectum. May be anal outlet. To me this does not appear consistent with bleeding from higher in the colon unless at rapid paced and then would expect his hemoglobin to be dropping. The bleeding has been improving. There is only a trace pink material on exam presently. No bloody stools since yesterday. Hemoglobin stable currently. Recommend clear liquid diet and continue to follow his hemoglobins overnight if the patient will agree to this.   These services were provided by Stephens November, MSN, [facility], in collaboration with Hassell Done  Gustavo Lah, M.D., with whom this patient was discussed in full. Thank you very much for this consult.   ____________________________ Theodore Demark, NP chl:jm D: 12/31/2012 16:55:58 ET T: 12/31/2012 17:25:40 ET JOB#: [id number]  cc: Theodore Demark, NP, <Dictator> Media SIGNED 01/17/2013 13:06

## 2014-06-02 NOTE — Discharge Summary (Signed)
PATIENT NAME:  Cody Herrera, Cody Herrera MR#:  [id number] DATE OF BIRTH:  09/17/1945  DATE OF ADMISSION:  12/26/2012 DATE OF DISCHARGE:  12/29/2012  ADMISSION DIAGNOSES:  1.  Gastrointestinal bleed.  2.  Acute blood loss anemia.   DISCHARGE DIAGNOSES: 1.  Gastrointestinal bleed due to arterial bleed in hepatic flexure status post embolization of mesenteric artery. 2.  Acute blood loss anemia.  3.  Hemorrhagic shock. 4.  History of hypertension.  5.  History of coronary artery disease.  CONSULTATIONS: Dr. Delana Meyer, GI, and Dr. Ubaldo Glassing for cardiology.    PROCEDURE: On December 26, 2012, the patient underwent an embolization of mesenteric branches.   Discharge hemoglobin is 8.2.   HOSPITAL COURSE: This is a very pleasant 69 year old male who presented with bright red blood per rectum. Obtained a GI bleeding scan, which was positive for bleeding at the hepatic flexure. Underwent a stat embolization by vascular surgery and then was admitted to the hospitalist service. For further details, please refer to the H and P.  1.  GI bleed with positive bleeding initially on GI bleeding scan at the hepatic flexure. The patient underwent embolization by Dr. Delana Meyer. His hemoglobin has remained stable after his blood transfusions. Appreciate vascular surgery and GI consultation.  2.  Acute blood loss anemia, status post 3 units of PRBCs. His hemoglobin has remained stable.  3.  Hemorrhagic shock, improved with fluids and blood products. His blood pressure remained stable.  4.  Hypertension. The patient's blood pressure is now appropriate.  5.  History of CAD, status post PCI. The patient was on Plavix and aspirin. His stent was placed in 2008. Cardiology had seen the patient in consultation and recommended just aspirin and the patient does not need Plavix.   DISCHARGE MEDICATIONS: 1.  Atenolol 100 mg daily.  2.  Simvastatin 40 mg daily.  3.  Lisinopril 10 mg daily.  4.  Aspirin 81 mg daily.   DISCHARGE  DIET: Low-sodium, low-fat, low-cholesterol diet. Soft diet for a week.   The patient is medically stable for discharge.   TIME SPENT: Approximately 35 minutes.   ____________________________ Donell Beers. Benjie Karvonen, MD spm:jcm D: 12/29/2012 13:36:29 ET T: 12/29/2012 14:25:23 ET JOB#: [id number]  cc: Kaelum Kissick P. Benjie Karvonen, MD, <Dictator> Donell Beers Symphanie Cederberg MD ELECTRONICALLY SIGNED 12/30/2012 14:02

## 2014-06-02 NOTE — Consult Note (Signed)
   Present Illness 69 yo male with history of cad s/p pci of al1 in 2008 with a Taxus stent and a Xience stent in 2011 at [facility]. He has been on clopidigrel at 75 g since that time. He has done well with no evidence of cad. HE was admitted yesterday with gi bleed. He was taken off of asa and plavix and transfused. He is currently stable with no obvious bleeding. He has undergone embolization of hepatic flexure per vascular surgery. His hgb was down somewhat this am and he recieved further blood. No obvious sign of bleeding. He does not wish to go back on plavix. He is 3 years s/p his most recent stent and has no cardiac symtpoms. He has been on simvastatin, gemfibrizol, atenolol and lisinopril.   Physical Exam:  GEN well developed, no acute distress   HEENT PERRL   NECK supple  No masses   RESP normal resp effort   CARD Regular rate and rhythm  No murmur   ABD denies tenderness  normal BS  no Adominal Mass   LYMPH negative neck   EXTR negative cyanosis/clubbing, negative edema   SKIN normal to palpation   NEURO cranial nerves intact, motor/sensory function intact   PSYCH A+O to time, place, person   Review of Systems:  Subjective/Chief Complaint dark tarry stools   General: Fatigue   Skin: No Complaints   ENT: No Complaints   Eyes: No Complaints   Neck: No Complaints   Respiratory: No Complaints   Cardiovascular: No Complaints   Gastrointestinal: Rectal Bleeding  Black tarry stools   Genitourinary: No Complaints   Vascular: No Complaints   Musculoskeletal: No Complaints   Neurologic: No Complaints   Hematologic: No Complaints   Endocrine: No Complaints   Psychiatric: No Complaints   Review of Systems: All other systems were reviewed and found to be negative   Medications/Allergies Reviewed Medications/Allergies reviewed   EKG:  EKG NSR    No Known Allergies:    Impression 69 yo male with history of cad s/p pci with stent times two in 2008 and 2011.  He now is admitted with a hepatic flexure lower gi bleed treated with embolizaiton by vascular surgery. He received several units of blood. He is being followed by gi and considered for colonscopy. He is stable from a cardiac standpoint. He is 3 years post stent. Based on ACC/AHA guidelines, he may remain off of clopidigrel. He should be discharged on atenolol, simvastatin, gemfibrizol, lisinpril and asa if acceptable by gi.   Plan 1. Coinitnue with atenolol, lisinopril, simvastatin, gemfibrizol. 2. Resume enteric asa 81 mg daily when ok from gi standpoint 3. OK to discontinue clopidigrel 4. No further cardiac workup indicated at present.   Electronic Signatures: Teodoro Spray (MD)  (Signed [phone] 19:46)  Authored: General Aspect/Present Illness, History and Physical Exam, Review of System, EKG , Allergies, Impression/Plan   Last Updated: 17-Nov-14 19:46 by Teodoro Spray (MD)

## 2017-10-08 ENCOUNTER — Other Ambulatory Visit: Payer: Self-pay

## 2017-10-08 ENCOUNTER — Emergency Department: Payer: Medicare HMO

## 2017-10-08 ENCOUNTER — Observation Stay
Admission: EM | Admit: 2017-10-08 | Discharge: 2017-10-09 | Disposition: A | Discharge status: Home / Self Care | Payer: Medicare HMO | Attending: Internal Medicine | Admitting: Internal Medicine

## 2017-10-08 ENCOUNTER — Encounter: Payer: Self-pay | Admitting: Emergency Medicine

## 2017-10-08 DIAGNOSIS — N400 Enlarged prostate without lower urinary tract symptoms: Secondary | ICD-10-CM | POA: Insufficient documentation

## 2017-10-08 DIAGNOSIS — E7800 Pure hypercholesterolemia, unspecified: Secondary | ICD-10-CM | POA: Diagnosis not present

## 2017-10-08 DIAGNOSIS — K314 Gastric diverticulum: Secondary | ICD-10-CM | POA: Insufficient documentation

## 2017-10-08 DIAGNOSIS — Z8249 Family history of ischemic heart disease and other diseases of the circulatory system: Secondary | ICD-10-CM | POA: Diagnosis not present

## 2017-10-08 DIAGNOSIS — R0789 Other chest pain: Secondary | ICD-10-CM | POA: Insufficient documentation

## 2017-10-08 DIAGNOSIS — R079 Chest pain, unspecified: Secondary | ICD-10-CM | POA: Diagnosis present

## 2017-10-08 DIAGNOSIS — D734 Cyst of spleen: Secondary | ICD-10-CM | POA: Insufficient documentation

## 2017-10-08 DIAGNOSIS — Z79899 Other long term (current) drug therapy: Secondary | ICD-10-CM | POA: Diagnosis not present

## 2017-10-08 DIAGNOSIS — Z7982 Long term (current) use of aspirin: Secondary | ICD-10-CM | POA: Insufficient documentation

## 2017-10-08 DIAGNOSIS — K5730 Diverticulosis of large intestine without perforation or abscess without bleeding: Secondary | ICD-10-CM | POA: Insufficient documentation

## 2017-10-08 DIAGNOSIS — I700 Atherosclerosis of aorta: Secondary | ICD-10-CM | POA: Diagnosis not present

## 2017-10-08 DIAGNOSIS — M5431 Sciatica, right side: Secondary | ICD-10-CM | POA: Insufficient documentation

## 2017-10-08 DIAGNOSIS — I2510 Atherosclerotic heart disease of native coronary artery without angina pectoris: Secondary | ICD-10-CM | POA: Diagnosis not present

## 2017-10-08 DIAGNOSIS — Z955 Presence of coronary angioplasty implant and graft: Secondary | ICD-10-CM | POA: Diagnosis not present

## 2017-10-08 DIAGNOSIS — M5432 Sciatica, left side: Secondary | ICD-10-CM | POA: Diagnosis not present

## 2017-10-08 DIAGNOSIS — I712 Thoracic aortic aneurysm, without rupture: Secondary | ICD-10-CM | POA: Diagnosis not present

## 2017-10-08 DIAGNOSIS — I119 Hypertensive heart disease without heart failure: Secondary | ICD-10-CM | POA: Diagnosis not present

## 2017-10-08 HISTORY — DX: Pure hypercholesterolemia, unspecified: E78.00

## 2017-10-08 HISTORY — DX: Essential (primary) hypertension: I10

## 2017-10-08 LAB — CBC
HCT: 42.4 % (ref 40.0–52.0)
Hemoglobin: 14.3 g/dL (ref 13.0–18.0)
MCH: 31 pg (ref 26.0–34.0)
MCHC: 33.8 g/dL (ref 32.0–36.0)
MCV: 91.6 fL (ref 80.0–100.0)
Platelets: 190 10*3/uL (ref 150–440)
RBC: 4.63 MIL/uL (ref 4.40–5.90)
RDW: 13.2 % (ref 11.5–14.5)
WBC: 6.7 10*3/uL (ref 3.8–10.6)

## 2017-10-08 LAB — BASIC METABOLIC PANEL
Anion gap: 7 (ref 5–15)
BUN: 19 mg/dL (ref 8–23)
CO2: 30 mmol/L (ref 22–32)
Calcium: 9 mg/dL (ref 8.9–10.3)
Chloride: 103 mmol/L (ref 98–111)
Creatinine, Ser: 0.9 mg/dL (ref 0.61–1.24)
GFR calc Af Amer: >60 mL/min (ref >60)
GFR calc non Af Amer: >60 mL/min (ref >60)
Glucose, Bld: 134 mg/dL — ABNORMAL HIGH (ref 70–99)
Potassium: 4 mmol/L (ref 3.5–5.1)
Sodium: 140 mmol/L (ref 135–145)

## 2017-10-08 LAB — TROPONIN I
Troponin I: <0.03 ng/mL (ref <0.03)
Troponin I: <0.03 ng/mL (ref <0.03)
Troponin I: <0.03 ng/mL (ref <0.03)

## 2017-10-08 MED ORDER — ACETAMINOPHEN 500 MG PO TABS
1000.0000 mg | ORAL_TABLET | Freq: Once | ORAL | Status: AC
  Administered 2017-10-08: 1000 mg via ORAL
  Filled 2017-10-08: qty 2

## 2017-10-08 MED ORDER — ACETAMINOPHEN 325 MG PO TABS
650.0000 mg | ORAL_TABLET | Freq: Four times a day (QID) | ORAL | Status: DC | PRN
  Administered 2017-10-08: 650 mg via ORAL
  Filled 2017-10-08: qty 2

## 2017-10-08 MED ORDER — ASPIRIN EC 81 MG PO TBEC
81.0000 mg | DELAYED_RELEASE_TABLET | Freq: Every day | ORAL | Status: DC
  Administered 2017-10-08 – 2017-10-09 (×2): 81 mg via ORAL
  Filled 2017-10-08 (×2): qty 1

## 2017-10-08 MED ORDER — ENOXAPARIN SODIUM 40 MG/0.4ML ~~LOC~~ SOLN
40.0000 mg | SUBCUTANEOUS | Status: DC
  Filled 2017-10-08: qty 0.4

## 2017-10-08 MED ORDER — LISINOPRIL 20 MG PO TABS
20.0000 mg | ORAL_TABLET | Freq: Every day | ORAL | Status: DC
  Administered 2017-10-08 – 2017-10-09 (×2): 20 mg via ORAL
  Filled 2017-10-08 (×2): qty 1

## 2017-10-08 MED ORDER — NITROGLYCERIN 0.4 MG SL SUBL: 0.4000 mg | SUBLINGUAL_TABLET | SUBLINGUAL | Status: DC | PRN

## 2017-10-08 MED ORDER — PREDNISONE 20 MG PO TABS: 20.0000 mg | ORAL_TABLET | Freq: Every day | ORAL | Status: DC

## 2017-10-08 MED ORDER — PREDNISONE 10 MG PO TABS: 10.0000 mg | ORAL_TABLET | Freq: Every day | ORAL | Status: DC

## 2017-10-08 MED ORDER — PREDNISONE 20 MG PO TABS
30.0000 mg | ORAL_TABLET | Freq: Every day | ORAL | Status: DC
  Administered 2017-10-09: 30 mg via ORAL
  Filled 2017-10-08: qty 1

## 2017-10-08 MED ORDER — ACETAMINOPHEN 650 MG RE SUPP: 650.0000 mg | Freq: Four times a day (QID) | RECTAL | Status: DC | PRN

## 2017-10-08 MED ORDER — ATENOLOL 100 MG PO TABS
100.0000 mg | ORAL_TABLET | Freq: Every day | ORAL | Status: DC
  Administered 2017-10-08 – 2017-10-09 (×2): 100 mg via ORAL
  Filled 2017-10-08 (×2): qty 1

## 2017-10-08 MED ORDER — PREDNISONE 20 MG PO TABS
40.0000 mg | ORAL_TABLET | Freq: Every day | ORAL | Status: DC
  Administered 2017-10-08: 40 mg via ORAL
  Filled 2017-10-08: qty 2

## 2017-10-08 MED ORDER — KETOROLAC TROMETHAMINE 30 MG/ML IJ SOLN
30.0000 mg | Freq: Four times a day (QID) | INTRAMUSCULAR | Status: DC | PRN
  Filled 2017-10-08: qty 1

## 2017-10-08 MED ORDER — ONDANSETRON HCL 4 MG/2ML IJ SOLN: 4.0000 mg | Freq: Four times a day (QID) | INTRAMUSCULAR | Status: DC | PRN

## 2017-10-08 MED ORDER — ONDANSETRON HCL 4 MG PO TABS: 4.0000 mg | ORAL_TABLET | Freq: Four times a day (QID) | ORAL | Status: DC | PRN

## 2017-10-08 MED ORDER — IOPAMIDOL (ISOVUE-370) INJECTION 76%
100.0000 mL | Freq: Once | INTRAVENOUS | Status: AC | PRN
  Administered 2017-10-08: 100 mL via INTRAVENOUS

## 2017-10-08 MED ORDER — SIMVASTATIN 20 MG PO TABS
40.0000 mg | ORAL_TABLET | Freq: Every day | ORAL | Status: DC
  Filled 2017-10-08: qty 2

## 2017-10-08 MED ORDER — FLAXSEED OIL 1000 MG PO CAPS: 1.0000 | ORAL_CAPSULE | Freq: Every day | ORAL | Status: DC

## 2017-10-08 NOTE — Care Management Obs Status (Signed)
[facility] NOTIFICATION   Patient Details  Name: Cody Herrera MRN: [id number] Date of Birth: [birth year]   Medicare Observation Status Notification Given:  Yes    Elza Rafter, RN 10/08/2017, 4:04 PM

## 2017-10-08 NOTE — ED Provider Notes (Signed)
[facility] Emergency Department Provider Note  ____________________________________________  Time seen: Approximately 8:30 AM  I have reviewed the triage vital signs and the nursing notes.   HISTORY  Chief Complaint Chest Pain   HPI Cody Herrera is a 72 y.o. male with a history of hypertension, hyperlipidemia, coronary artery disease status post stents in 2000 and 09/2009 who presents for evaluation of chest pain and bilateral leg pain.  Patient reports that his symptoms started 2 days ago.  He woke up with left leg pain that he describes as a dull ache pain that starts in his left buttock and goes down his leg.  Right after that he developed similar pain on the right leg and chest pain.  The pain on the right leg is identical to the one on the left but less intense.  No weakness or numbness, no back pain or saddle anesthesia, no urinary or bowel loss or retention. He also developed CP 2 days ago and he describes it as a dull achy pain that was constant for the last 2 days, diffuse across his chest, non radiating, not worse with exertion. No SOB, N/V.  This morning patient reports taking a full aspirin and 1 sublingual nitro which resolved the chest pain.  Patient denies history of sciatica, back pain, or doing any type of lifting or heavy exercise the day prior to the pain starting.  He denies personal or family history of blood clots, recent travel immobilization, leg swelling, hemoptysis, or exogenous hormones.  He has no chest pain at this time. Has not seen a cardiologist since his last stent placed.  Past Medical History:  Diagnosis Date  . Hypercholesteremia   . Hypertension   CAD   Allergies Patient has no known allergies.  FH Coronary Artery Disease (Blocked arteries around heart) Brother    Heart disease Brother    Myocardial Infarction (Heart attack) Brother    Heart disease Father    Myocardial Infarction (Heart attack) Father     Heart disease Mother    Myocardial Infarction (Heart attack) Mother    High blood pressure (Hypertension) Other    Hyperlipidemia (Elevated cholesterol) Other    Heart disease Sister    Myocardial Infarction (Heart attack) Sister    Heart disease Sister       Social History Social History   Tobacco Use  . Smoking status: Never Smoker  . Smokeless tobacco: Never Used  Substance Use Topics  . Alcohol use: Yes    Frequency: Never    Comment: ocassinally  . Drug use: Never    Review of Systems  Constitutional: Negative for fever. Eyes: Negative for visual changes. ENT: Negative for sore throat. Neck: No neck pain  Cardiovascular: + chest pain. Respiratory: Negative for shortness of breath. Gastrointestinal: Negative for abdominal pain, vomiting or diarrhea. Genitourinary: Negative for dysuria. Musculoskeletal: Negative for back pain. + b/l leg pain Skin: Negative for rash. Neurological: Negative for headaches, weakness or numbness. Psych: No SI or HI  ____________________________________________   PHYSICAL EXAM:  VITAL SIGNS: ED Triage Vitals  Enc Vitals Group     BP 10/08/17 0729 137/81     Pulse Rate 10/08/17 0729 70     Resp 10/08/17 0729 16     Temp 10/08/17 0729 98.3 F (36.8 C)     Temp Source 10/08/17 0729 Oral     SpO2 10/08/17 0729 100 %     Weight 10/08/17 0726 190 lb (86.2 kg)  Height 10/08/17 0726 5\' 8"  (1.727 m)     Head Circumference --      Peak Flow --      Pain Score 10/08/17 0726 2     Pain Loc --      Pain Edu? --      Excl. in [place]? --     Constitutional: Alert and oriented. Well appearing and in no apparent distress. HEENT:      Head: Normocephalic and atraumatic.         Eyes: Conjunctivae are normal. Sclera is non-icteric.       Mouth/Throat: Mucous membranes are moist.       Neck: Supple with no signs of meningismus. Cardiovascular: Regular rate and rhythm. No murmurs, gallops, or rubs. No JVD. Respiratory:  Normal respiratory effort. Lungs are clear to auscultation bilaterally. No wheezes, crackles, or rhonchi.  Gastrointestinal: Soft, non tender, and non distended with positive bowel sounds. No rebound or guarding. Musculoskeletal: No midline CT and L-spine tenderness.  Full painless range of motion of all joints in bilateral lower extremities, no cyanosis, erythema, or edema.  Patient has 2+ DP and PT pulses on the left and 1+ on the right, brisk capilarry refill bilaterally. Neurologic: Normal speech and language. Face is symmetric. Moving all extremities. No gross focal neurologic deficits are appreciated. Skin: Skin is warm, dry and intact. No rash noted. Psychiatric: Mood and affect are normal. Speech and behavior are normal.  ____________________________________________   LABS (all labs ordered are listed, but only abnormal results are displayed)  Labs Reviewed  BASIC METABOLIC PANEL - Abnormal; Notable for the following components:      Result Value   Glucose, Bld 134 (*)    All other components within normal limits  CBC  TROPONIN I  TROPONIN I  HIV ANTIBODY (ROUTINE TESTING)  TROPONIN I  TROPONIN I   ____________________________________________  EKG  ED ECG REPORT I, Rudene Re, the attending physician, personally viewed and interpreted this ECG.  Normal sinus rhythm, first-degree AV block, rate of 76, normal QRS and QTC, normal axis, no ST elevations or depressions, tall but not peaked T waves in anterior and lateral leads. No prior for comparison  ____________________________________________  RADIOLOGY  I have personally reviewed the images performed during this visit and I agree with the Radiologist's read.   Interpretation by Radiologist:  Dg Chest 2 View  Result Date: 10/08/2017 CLINICAL DATA:  Chest and back pain. EXAM: CHEST - 2 VIEW COMPARISON:  01/22/2018. FINDINGS: Cardiomegaly with mild pulmonary vascular prominence. No focal infiltrate. No pleural  effusion or pneumothorax. No acute bony abnormality. Degenerative change thoracic spine. IMPRESSION: Cardiomegaly with mild pulmonary venous congestion. Electronically Signed   By: Marcello Moores  Register   On: 10/08/2017 08:43   US Venous Img Lower Bilateral  Result Date: 10/08/2017 CLINICAL DATA:  Bilateral lower extremity pain for the past week. Evaluate for DVT. EXAM: BILATERAL LOWER EXTREMITY VENOUS DOPPLER ULTRASOUND TECHNIQUE: Gray-scale sonography with graded compression, as well as color Doppler and duplex ultrasound were performed to evaluate the lower extremity deep venous systems from the level of the common femoral vein and including the common femoral, femoral, profunda femoral, popliteal and calf veins including the posterior tibial, peroneal and gastrocnemius veins when visible. The superficial great saphenous vein was also interrogated. Spectral Doppler was utilized to evaluate flow at rest and with distal augmentation maneuvers in the common femoral, femoral and popliteal veins. COMPARISON:  None. FINDINGS: RIGHT LOWER EXTREMITY Common Femoral Vein: No evidence of  thrombus. Normal compressibility, respiratory phasicity and response to augmentation. Saphenofemoral Junction: No evidence of thrombus. Normal compressibility and flow on color Doppler imaging. Profunda Femoral Vein: No evidence of thrombus. Normal compressibility and flow on color Doppler imaging. Femoral Vein: No evidence of thrombus. Normal compressibility, respiratory phasicity and response to augmentation. Popliteal Vein: No evidence of thrombus. Normal compressibility, respiratory phasicity and response to augmentation. Calf Veins: No evidence of thrombus. Normal compressibility and flow on color Doppler imaging. Superficial Great Saphenous Vein: No evidence of thrombus. Normal compressibility. Venous Reflux:  None. Other Findings:  None. LEFT LOWER EXTREMITY Common Femoral Vein: No evidence of thrombus. Normal compressibility,  respiratory phasicity and response to augmentation. Saphenofemoral Junction: No evidence of thrombus. Normal compressibility and flow on color Doppler imaging. Profunda Femoral Vein: No evidence of thrombus. Normal compressibility and flow on color Doppler imaging. Femoral Vein: No evidence of thrombus. Normal compressibility, respiratory phasicity and response to augmentation. Popliteal Vein: No evidence of thrombus. Normal compressibility, respiratory phasicity and response to augmentation. Calf Veins: No evidence of thrombus. Normal compressibility and flow on color Doppler imaging. Superficial Great Saphenous Vein: No evidence of thrombus. Normal compressibility. Venous Reflux:  None. Other Findings:  None. IMPRESSION: No evidence of DVT within either lower extremity. Electronically Signed   By: Sandi Mariscal M.D.   On: 10/08/2017 09:02   Ct Angio Chest/abd/pel For Dissection W And/or Wo Contrast  Result Date: 10/08/2017 CLINICAL DATA:  Chest tightness and pain radiating to legs. History of coronary stents. EXAM: CT ANGIOGRAPHY CHEST, ABDOMEN AND PELVIS TECHNIQUE: Multidetector CT imaging through the chest, abdomen and pelvis was performed using the standard protocol during bolus administration of intravenous contrast. Multiplanar reconstructed images and MIPs were obtained and reviewed to evaluate the vascular anatomy. CONTRAST:  175mL ISOVUE-370 IOPAMIDOL (ISOVUE-370) INJECTION 76% COMPARISON:  None. FINDINGS: CTA CHEST FINDINGS Cardiovascular: Heart size normal. No pericardial effusion. Satisfactory opacification of pulmonary arteries noted, and there is no evidence of pulmonary emboli. Coronary calcifications/stent. Dilated ascending aorta up to 4.5 cm transverse diameter. There is good contrast opacification of the aorta. No dissection or stenosis. Classic 3 vessel brachiocephalic arterial origin anatomy without proximal stenosis. Mild calcified atheromatous plaque in the descending segment.  Mediastinum/Nodes: No hilar or mediastinal adenopathy. Lungs/Pleura: No pleural effusion.  No pneumothorax.  Lungs clear. Musculoskeletal: Schmorl's nodes in the lower thoracic spine. No fracture or worrisome bone lesion. Review of the MIP images confirms the above findings. CTA ABDOMEN AND PELVIS FINDINGS VASCULAR Aorta: No aneurysm, dissection, or stenosis. Scattered calcified atheromatous plaque. Celiac: Patent without evidence of aneurysm, dissection, vasculitis or significant stenosis. SMA: Patent without evidence of aneurysm, dissection, vasculitis or significant stenosis. Renals: Single right renal artery, widely patent. Duplicated left renal arteries, superior dominant, both patent. IMA: Patent without evidence of aneurysm, dissection, vasculitis or significant stenosis. Inflow: Minimal plaque in the right common iliac artery. Mild tortuosity. No aneurysm, dissection, or stenosis. Visualized proximal outflow patent. Veins: No obvious venous abnormality within the limitations of this arterial phase study. Review of the MIP images confirms the above findings. NON-VASCULAR Hepatobiliary: No focal liver abnormality is seen. No gallstones, gallbladder wall thickening, or biliary dilatation. Pancreas: Unremarkable. No pancreatic ductal dilatation or surrounding inflammatory changes. Spleen: Normal in size.  3.6 cm peripherally calcified splenic cyst. Adrenals/Urinary Tract: Adrenal glands are unremarkable. Kidneys are normal, without renal calculi, focal lesion, or hydronephrosis. Bladder is unremarkable. Stomach/Bowel: 5 cm diverticulum projecting posteriorly from the gastric fundus. The stomach is decompressed. Small bowel is nondilated. Terminal ileum  unremarkable. Normal appendix. Scattered diverticula from descending colon without significant adjacent inflammatory/edematous change. Lymphatic: No abdominal or pelvic adenopathy. Reproductive: Prostatic enlargement with central coarse calcifications. Other: No  ascites.  No free air. 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 No acute fracture or worrisome bone lesion. Review of the MIP images confirms the above findings. IMPRESSION: 1. Coronary and Aortic Atherosclerosis (ICD10-170.0) without dissection. 2. 4.5 cm ascending thoracic aortic aneurysm (ICD10-I71.9). Recommend semi-annual imaging followup by CTA or MRA and referral to cardiothoracic surgery if not already obtained. This recommendation follows 2010 ACCF/AHA/AATS/ACR/ASA/SCA/SCAI/SIR/STS/SVM Guidelines for the Diagnosis and Management of Patients With Thoracic Aortic Disease. Circulation. 2010; 121: N277-O242 3. Descending colon diverticulosis. 4. 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 5. Prostatic enlargement 6. 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 Electronically Signed   By: Lucrezia Europe M.D.   On: 10/08/2017 10:38      ____________________________________________   PROCEDURES  Procedure(s) performed: None Procedures Critical Care performed:  None ____________________________________________   INITIAL IMPRESSION / ASSESSMENT AND PLAN / ED COURSE  72 y.o. male with a history of hypertension, hyperlipidemia, coronary artery disease status post stents in 2000 and 09/2009 who presents for evaluation of chest pain and bilateral leg pain.  Patient is well-appearing, no distress, has normal vital signs, neurologically intact, does have slightly asymmetric DP and PT pulses with 1+ on the right and 2+ on the left but normal brisk capillary refill otherwise, abdomen is soft with no tenderness and no bruits, heart regular rate and rhythm with no murmurs.  At this time patient can have a 2 different possible etiologies for his complaints.  The leg pain seems to start of the sciatic notch bilaterally and go down his legs which could be the etiology of his leg pain.  There are no signs or symptoms of cauda equina. However with a dull achy chest pain and pain in  his legs a PE or even dissection are also possible at this time.  Therefore patient will be sent for CT angiogram of the chest and Doppler studies of his bilateral lower extremities.  At this time patient has no chest pain after taking sublingual nitro and if bilateral leg pain is due solely to sciatica he could be that his chest pain was also an ACS equivalent.  Patient has not seen a cardiologist in over 8 years.  If CTs are negative he will be admitted for further chest pain evaluation.  Clinical Course as of Oct 09 1430  Thu Oct 08, 2017  0958 CT negative for dissection or PE. Patient is high risk for ACS, does not have a cardiologist and has not been seen by cardiology in 8 years, will admit for CP evaluation.   [CV]    Clinical Course User Index [CV] Alfred Levins [place], MD     As part of my medical decision making, I reviewed the following data within the [facility] notes reviewed and incorporated, Labs reviewed , EKG interpreted , Old chart reviewed, Radiograph reviewed , Discussed with admitting physician , Notes from prior ED visits and [place] Controlled Substance Database    Pertinent labs & imaging results that were available during my care of the patient were reviewed by me and considered in my medical decision making (see chart for details).    ____________________________________________   FINAL CLINICAL IMPRESSION(S) / ED DIAGNOSES  Final diagnoses:  Chest pain, unspecified type  Bilateral sciatica      NEW MEDICATIONS STARTED DURING THIS VISIT:  ED Discharge Orders    None       Note:  This document was prepared using Dragon voice recognition software and may include unintentional dictation errors.    Alfred Levins, [place], MD 10/08/17 [id number]

## 2017-10-08 NOTE — H&P (Signed)
[facility] NAME: Cody Herrera    MR#:  [id number]  DATE OF BIRTH:  [birth year]  DATE OF ADMISSION:  10/08/2017  PRIMARY CARE PHYSICIAN: Valera Castle, MD   REQUESTING/REFERRING PHYSICIAN: Dr. Rudene Re  CHIEF COMPLAINT:   Chief Complaint  Patient presents with  . Chest Pain    HISTORY OF PRESENT ILLNESS:  Cody Herrera  is a 72 y.o. male with a known history of hypertension, hyperlipidemia, history of coronary artery disease with stent placement who presents to the hospital due to chest pain.  Patient says this past Tuesday he first developed some left hip/buttock pain which radiated to his left leg.  He took some Tylenol which helped alleviate his pain.  He also then developed some chest pain in the center of his chest which did not radiate to his left arm or neck.  He denies any nausea vomiting diaphoresis palpitations or any other associated symptoms with his chest pain.  Patient does have a previous history of coronary artery disease with stent placement many years ago but has lost follow-up with his cardiologist.  Patient also has a strong family history of heart disease.  He presents to the ER with the above complaints and given his risk factors hospitalist services were contacted for admission.  Patient is currently chest pain-free.  PAST MEDICAL HISTORY:   Past Medical History:  Diagnosis Date  . Hypercholesteremia   . Hypertension     PAST SURGICAL HISTORY:  History reviewed. No pertinent surgical history.  SOCIAL HISTORY:   Social History   Tobacco Use  . Smoking status: Never Smoker  . Smokeless tobacco: Never Used  Substance Use Topics  . Alcohol use: Yes    Frequency: Never    Comment: ocassinally    FAMILY HISTORY:   Family History  Problem Relation Age of Onset  . Heart attack Mother   . Heart attack Father     DRUG ALLERGIES:  No Known Allergies  REVIEW OF SYSTEMS:    Review of Systems  Constitutional: Negative for fever and weight loss.  HENT: Negative for congestion, nosebleeds and tinnitus.   Eyes: Negative for blurred vision, double vision and redness.  Respiratory: Negative for cough, hemoptysis and shortness of breath.   Cardiovascular: Positive for chest pain. Negative for orthopnea, leg swelling and PND.  Gastrointestinal: Negative for abdominal pain, diarrhea, melena, nausea and vomiting.  Genitourinary: Negative for dysuria, hematuria and urgency.  Musculoskeletal: Negative for falls and joint pain.  Neurological: Negative for dizziness, tingling, sensory change, focal weakness, seizures, weakness and headaches.  Endo/Heme/Allergies: Negative for polydipsia. Does not bruise/bleed easily.  Psychiatric/Behavioral: Negative for depression and memory loss. The patient is not nervous/anxious.     MEDICATIONS AT HOME:   Prior to Admission medications   Medication Sig Start Date End Date Taking? Authorizing Provider  aspirin EC 81 MG tablet Take 81 mg by mouth daily.   Yes [provider]  atenolol (TENORMIN) 100 MG tablet Take 100 mg by mouth daily. 07/07/17  Yes [provider]  Flaxseed, Linseed, (FLAXSEED OIL) 1000 MG CAPS Take 1 capsule by mouth daily.   Yes [provider]  lisinopril (PRINIVIL,ZESTRIL) 20 MG tablet Take 20 mg by mouth daily. 07/07/17  Yes [provider]  simvastatin (ZOCOR) 40 MG tablet Take 40 mg by mouth at bedtime. 07/07/17  Yes [provider]      VITAL SIGNS:  Blood pressure (!) 138/93, pulse 66,  temperature 98.3 F (36.8 C), temperature source Oral, resp. rate 18, height 5\' 8"  (1.727 m), weight 86.2 kg, SpO2 99 %.  PHYSICAL EXAMINATION:  Physical Exam  GENERAL:  72 y.o.-year-old patient lying in the bed with no acute distress.  EYES: Pupils equal, round, reactive to light and accommodation. No scleral icterus. Extraocular muscles intact.  HEENT: Head atraumatic,  normocephalic. Oropharynx and nasopharynx clear. No oropharyngeal erythema, moist oral mucosa  NECK:  Supple, no jugular venous distention. No thyroid enlargement, no tenderness.  LUNGS: Normal breath sounds bilaterally, no wheezing, rales, rhonchi. No use of accessory muscles of respiration.  CARDIOVASCULAR: S1, S2 RRR. No murmurs, rubs, gallops, clicks.  ABDOMEN: Soft, nontender, nondistended. Bowel sounds present. No organomegaly or mass.  EXTREMITIES: No pedal edema, cyanosis, or clubbing. + 2 pedal & radial pulses b/l.   NEUROLOGIC: Cranial nerves II through XII are intact. No focal Motor or sensory deficits appreciated b/l PSYCHIATRIC: The patient is alert and oriented x 3.  SKIN: No obvious rash, lesion, or ulcer.   LABORATORY PANEL:   CBC Recent Labs  Lab 10/08/17 0747  WBC 6.7  HGB 14.3  HCT 42.4  PLT 190   ------------------------------------------------------------------------------------------------------------------  Chemistries  Recent Labs  Lab 10/08/17 0747  NA 140  K 4.0  CL 103  CO2 30  GLUCOSE 134*  BUN 19  CREATININE 0.90  CALCIUM 9.0   ------------------------------------------------------------------------------------------------------------------  Cardiac Enzymes Recent Labs  Lab 10/08/17 0747  TROPONINI <0.03   ------------------------------------------------------------------------------------------------------------------  RADIOLOGY:  Dg Chest 2 View  Result Date: 10/08/2017 CLINICAL DATA:  Chest and back pain. EXAM: CHEST - 2 VIEW COMPARISON:  01/22/2018. FINDINGS: Cardiomegaly with mild pulmonary vascular prominence. No focal infiltrate. No pleural effusion or pneumothorax. No acute bony abnormality. Degenerative change thoracic spine. IMPRESSION: Cardiomegaly with mild pulmonary venous congestion. Electronically Signed   By: Marcello Moores  Register   On: 10/08/2017 08:43   US Venous Img Lower Bilateral  Result Date: 10/08/2017 CLINICAL DATA:   Bilateral lower extremity pain for the past week. Evaluate for DVT. EXAM: BILATERAL LOWER EXTREMITY VENOUS DOPPLER ULTRASOUND TECHNIQUE: Gray-scale sonography with graded compression, as well as color Doppler and duplex ultrasound were performed to evaluate the lower extremity deep venous systems from the level of the common femoral vein and including the common femoral, femoral, profunda femoral, popliteal and calf veins including the posterior tibial, peroneal and gastrocnemius veins when visible. The superficial great saphenous vein was also interrogated. Spectral Doppler was utilized to evaluate flow at rest and with distal augmentation maneuvers in the common femoral, femoral and popliteal veins. COMPARISON:  None. FINDINGS: RIGHT LOWER EXTREMITY Common Femoral Vein: No evidence of thrombus. Normal compressibility, respiratory phasicity and response to augmentation. Saphenofemoral Junction: No evidence of thrombus. Normal compressibility and flow on color Doppler imaging. Profunda Femoral Vein: No evidence of thrombus. Normal compressibility and flow on color Doppler imaging. Femoral Vein: No evidence of thrombus. Normal compressibility, respiratory phasicity and response to augmentation. Popliteal Vein: No evidence of thrombus. Normal compressibility, respiratory phasicity and response to augmentation. Calf Veins: No evidence of thrombus. Normal compressibility and flow on color Doppler imaging. Superficial Great Saphenous Vein: No evidence of thrombus. Normal compressibility. Venous Reflux:  None. Other Findings:  None. LEFT LOWER EXTREMITY Common Femoral Vein: No evidence of thrombus. Normal compressibility, respiratory phasicity and response to augmentation. Saphenofemoral Junction: No evidence of thrombus. Normal compressibility and flow on color Doppler imaging. Profunda Femoral Vein: No evidence of thrombus. Normal compressibility and flow on color Doppler imaging. Femoral  Vein: No evidence of thrombus.  Normal compressibility, respiratory phasicity and response to augmentation. Popliteal Vein: No evidence of thrombus. Normal compressibility, respiratory phasicity and response to augmentation. Calf Veins: No evidence of thrombus. Normal compressibility and flow on color Doppler imaging. Superficial Great Saphenous Vein: No evidence of thrombus. Normal compressibility. Venous Reflux:  None. Other Findings:  None. IMPRESSION: No evidence of DVT within either lower extremity. Electronically Signed   By: Sandi Mariscal M.D.   On: 10/08/2017 09:02   Ct Angio Chest/abd/pel For Dissection W And/or Wo Contrast  Result Date: 10/08/2017 CLINICAL DATA:  Chest tightness and pain radiating to legs. History of coronary stents. EXAM: CT ANGIOGRAPHY CHEST, ABDOMEN AND PELVIS TECHNIQUE: Multidetector CT imaging through the chest, abdomen and pelvis was performed using the standard protocol during bolus administration of intravenous contrast. Multiplanar reconstructed images and MIPs were obtained and reviewed to evaluate the vascular anatomy. CONTRAST:  144mL ISOVUE-370 IOPAMIDOL (ISOVUE-370) INJECTION 76% COMPARISON:  None. FINDINGS: CTA CHEST FINDINGS Cardiovascular: Heart size normal. No pericardial effusion. Satisfactory opacification of pulmonary arteries noted, and there is no evidence of pulmonary emboli. Coronary calcifications/stent. Dilated ascending aorta up to 4.5 cm transverse diameter. There is good contrast opacification of the aorta. No dissection or stenosis. Classic 3 vessel brachiocephalic arterial origin anatomy without proximal stenosis. Mild calcified atheromatous plaque in the descending segment. Mediastinum/Nodes: No hilar or mediastinal adenopathy. Lungs/Pleura: No pleural effusion.  No pneumothorax.  Lungs clear. Musculoskeletal: Schmorl's nodes in the lower thoracic spine. No fracture or worrisome bone lesion. Review of the MIP images confirms the above findings. CTA ABDOMEN AND PELVIS FINDINGS VASCULAR  Aorta: No aneurysm, dissection, or stenosis. Scattered calcified atheromatous plaque. Celiac: Patent without evidence of aneurysm, dissection, vasculitis or significant stenosis. SMA: Patent without evidence of aneurysm, dissection, vasculitis or significant stenosis. Renals: Single right renal artery, widely patent. Duplicated left renal arteries, superior dominant, both patent. IMA: Patent without evidence of aneurysm, dissection, vasculitis or significant stenosis. Inflow: Minimal plaque in the right common iliac artery. Mild tortuosity. No aneurysm, dissection, or stenosis. Visualized proximal outflow patent. Veins: No obvious venous abnormality within the limitations of this arterial phase study. Review of the MIP images confirms the above findings. NON-VASCULAR Hepatobiliary: No focal liver abnormality is seen. No gallstones, gallbladder wall thickening, or biliary dilatation. Pancreas: Unremarkable. No pancreatic ductal dilatation or surrounding inflammatory changes. Spleen: Normal in size.  3.6 cm peripherally calcified splenic cyst. Adrenals/Urinary Tract: Adrenal glands are unremarkable. Kidneys are normal, without renal calculi, focal lesion, or hydronephrosis. Bladder is unremarkable. Stomach/Bowel: 5 cm diverticulum projecting posteriorly from the gastric fundus. The stomach is decompressed. Small bowel is nondilated. Terminal ileum unremarkable. Normal appendix. Scattered diverticula from descending colon without significant adjacent inflammatory/edematous change. Lymphatic: No abdominal or pelvic adenopathy. Reproductive: Prostatic enlargement with central coarse calcifications. Other: No ascites.  No free air. 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 No acute fracture or worrisome bone lesion. Review of the MIP images confirms the above findings. IMPRESSION: 1. Coronary and Aortic Atherosclerosis (ICD10-170.0) without dissection. 2. 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 Recommend semi-annual imaging followup by CTA or MRA and referral to cardiothoracic surgery if not already obtained. This recommendation follows 2010 ACCF/AHA/AATS/ACR/ASA/SCA/SCAI/SIR/STS/SVM Guidelines for the Diagnosis and Management of Patients With Thoracic Aortic Disease. Circulation. 2010; 121: Z124-P809 3. Descending colon diverticulosis. 4. 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 5. Prostatic enlargement 6. 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 Electronically Signed   By: Lucrezia Europe M.D.   On:  10/08/2017 10:38     IMPRESSION AND PLAN:   72 year old male with past medical history of hypertension, hyperlipidemia, previous history of coronary artery disease status post stent placement who presents to the hospital due to chest pain.  1.  Chest pain- patient does have significant risk factors given his history of coronary artery disease with stent placement and strong family history.  He has lost follow-up with a cardiologist. - His first set of cardiac markers are negative, his EKG shows some peaked T waves in the lateral leads but no other acute ST or T wave changes. -We will observe him on telemetry, cycle his cardiac markers, continue aspirin, nitro, beta-blocker, statin.  I will get a nuclear medicine stress test in the morning.  2.  Sciatica-this is a cause of patient's left buttock/hip pain radiating to his leg.  No obvious trauma noted. -We will place him on some Toradol IV as needed along with some prednisone taper.  3.  Essential  hypertension-continue atenolol, lisinopril.  4.  Hyperlipidemia-continue simvastatin.    All the records are reviewed and case discussed with ED provider. Management plans discussed with the patient, family and they are in agreement.  CODE STATUS: Full code  TOTAL TIME TAKING CARE OF THIS PATIENT: 40 minutes.    Henreitta Leber M.D on 10/08/2017 at 11:10 AM  Between 7am to 6pm - Pager - [phone]  After  6pm go to [email or web address] - password [email or web address] [facility]  Office  [phone]  CC: Primary care physician; Valera Castle, MD

## 2017-10-08 NOTE — ED Notes (Signed)
hospitalist in with pt now.  Pt in nad.  No chest pain, still has pain left buttock down leg.

## 2017-10-08 NOTE — ED Notes (Signed)
First Nurse Note: Patient complaining of chest tightness and pain down the back of his legs, states he has had 3 cardiac stents.  A&O, NAD.

## 2017-10-08 NOTE — Progress Notes (Signed)
Pt c/o severe pain 8-9, pt refuses Toradol as he states that this medicine will cost him a lot of money, pt states  "I know how hospitals, doctors,and nurses will be ripping people off" Pt also refuses Lovenox and blood draws, states "don't bring anything to the room that will cost my insurance to pay a lot of money" Pt keeps repeating that it will be better for him to go home and that he "now" understands  why this hospital has a "bad reputation" This nurse explained to the pt his rights, plan of care, and gave tylenol. Pt states that he will stay "just because" he has  a stress test tomorrow. Will continue to monitor.

## 2017-10-08 NOTE — ED Triage Notes (Signed)
Presents with some chest and back since yesterday  Took 1 ntg this am  With min relief

## 2017-10-08 NOTE — ED Notes (Signed)
Patient is standing in room.  Says it helps his back pain that radiates down his leg.  Hooked backt o monitor after he got up to void.

## 2017-10-08 NOTE — Care Management Note (Signed)
Case Management Note  Patient Details  Name: Cody Herrera MRN: [id number] Date of Birth: [birth year]  Subjective/Objective:   Patient states he lives alone at his home. Independent,  no home services, on RA at home.  Denies difficulty affording medications.   No needs identified at this time.                Action/Plan:   Expected Discharge Date:                  Expected Discharge Plan:     In-House Referral:     Discharge planning Services     Post Acute Care Choice:    Choice offered to:     DME Arranged:    DME Agency:     HH Arranged:    HH Agency:     Status of Service:     If discussed at [employer] of Stay Meetings, dates discussed:    Additional Comments:  Elza Rafter, RN 10/08/2017, 4:17 PM

## 2017-10-08 NOTE — ED Notes (Signed)
To ct scan by wheelchair.

## 2017-10-09 ENCOUNTER — Observation Stay (HOSPITAL_BASED_OUTPATIENT_CLINIC_OR_DEPARTMENT_OTHER): Payer: Medicare HMO

## 2017-10-09 DIAGNOSIS — R079 Chest pain, unspecified: Secondary | ICD-10-CM

## 2017-10-09 LAB — NM MYOCAR MULTI W/SPECT W/WALL MOTION / EF
Estimated workload: 1 METS
Exercise duration (min): 0 min
Exercise duration (sec): 0 s
LV dias vol: 87 mL (ref 62–150)
LV sys vol: 25 mL
MPHR: 150 {beats}/min
Peak HR: 103 {beats}/min
Percent HR: 68 %
Rest HR: 69 {beats}/min
SDS: 0
SRS: 7
SSS: 2
TID: 0.92

## 2017-10-09 LAB — HIV ANTIBODY (ROUTINE TESTING W REFLEX): HIV Screen 4th Generation wRfx: NONREACTIVE

## 2017-10-09 MED ORDER — FLAXSEED OIL 1000 MG PO CAPS: 1.0000 | ORAL_CAPSULE | Freq: Every day | ORAL | 0 refills | Status: AC

## 2017-10-09 MED ORDER — SIMVASTATIN 40 MG PO TABS: 40.0000 mg | ORAL_TABLET | Freq: Every day | ORAL | 0 refills | Status: DC

## 2017-10-09 MED ORDER — PREDNISONE 10 MG PO TABS: 10.0000 mg | ORAL_TABLET | Freq: Every day | ORAL | Status: DC

## 2017-10-09 MED ORDER — KETOROLAC TROMETHAMINE 30 MG/ML IJ SOLN: 15.0000 mg | Freq: Four times a day (QID) | INTRAMUSCULAR | Status: DC | PRN

## 2017-10-09 MED ORDER — REGADENOSON 0.4 MG/5ML IV SOLN
0.4000 mg | Freq: Once | INTRAVENOUS | Status: AC
  Administered 2017-10-09: 0.4 mg via INTRAVENOUS

## 2017-10-09 MED ORDER — ATENOLOL 100 MG PO TABS: 100.0000 mg | ORAL_TABLET | Freq: Every day | ORAL | 0 refills | Status: DC

## 2017-10-09 MED ORDER — PREDNISONE 20 MG PO TABS: 20.0000 mg | ORAL_TABLET | Freq: Every day | ORAL | Status: DC

## 2017-10-09 MED ORDER — SODIUM CHLORIDE 0.9% FLUSH
3.0000 mL | Freq: Two times a day (BID) | INTRAVENOUS | Status: DC
  Administered 2017-10-09: 3 mL via INTRAVENOUS

## 2017-10-09 MED ORDER — TECHNETIUM TC 99M TETROFOSMIN IV KIT
30.4670 | PACK | Freq: Once | INTRAVENOUS | Status: AC | PRN
  Administered 2017-10-09: 30.467 via INTRAVENOUS

## 2017-10-09 MED ORDER — LISINOPRIL 20 MG PO TABS: 20.0000 mg | ORAL_TABLET | Freq: Every day | ORAL | 0 refills | Status: DC

## 2017-10-09 MED ORDER — TECHNETIUM TC 99M TETROFOSMIN IV KIT
13.8900 | PACK | Freq: Once | INTRAVENOUS | Status: AC | PRN
  Administered 2017-10-09: 13.89 via INTRAVENOUS

## 2017-10-09 MED ORDER — ASPIRIN EC 81 MG PO TBEC: 81.0000 mg | DELAYED_RELEASE_TABLET | Freq: Every day | ORAL | 0 refills | Status: AC

## 2017-10-09 NOTE — Progress Notes (Signed)
Advanced care plan. Purpose of the Encounter: CODE STATUS Parties in Attendance:Patient Patient's Decision Capacity:Good Subjective/Patient's story: Presented for chest pain Objective/Medical story Needs cardiology work up with troponins and echocardiogram and stress test Goals of care determination:  Advance care directives and goals of care discussed Patient wants everything done which inculdes cpr, intubation and ventilator if need arises CODE STATUS: Full code Time spent discussing advanced care planning: 16 minutes

## 2017-10-09 NOTE — Discharge Summary (Signed)
[facility] NAME: Cody Herrera    MR#:  [id number]  DATE OF BIRTH:  08-Nov-1945  DATE OF ADMISSION:  10/08/2017 ADMITTING PHYSICIAN: Cody Leber, MD  DATE OF DISCHARGE: 10/09/2017  2:10 PM  PRIMARY CARE PHYSICIAN: Cody Castle, MD   ADMISSION DIAGNOSIS:  Bilateral sciatica [M54.31, M54.32] Chest pain, unspecified type [R07.9] Hypertension Hyperlipidemia Coronary artery disease DISCHARGE DIAGNOSIS:  Active Problems:   Chest pain Hypertension Hyperlipidemia Coronary artery disease  SECONDARY DIAGNOSIS:   Past Medical History:  Diagnosis Date  . Hypercholesteremia   . Hypertension      ADMITTING HISTORY Cody Herrera  is a 72 y.o. male with a known history of hypertension, hyperlipidemia, history of coronary artery disease with stent placement who presents to the hospital due to chest pain.  Patient says this past Tuesday he first developed some left hip/buttock pain which radiated to his left leg.  He took some Tylenol which helped alleviate his pain.  He also then developed some chest pain in the center of his chest which did not radiate to his left arm or neck.  He denies any nausea vomiting diaphoresis palpitations or any other associated symptoms with his chest pain.  Patient does have a previous history of coronary artery disease with stent placement many years ago but has lost follow-up with his cardiologist.  Patient also has a strong family history of heart disease.  He presents to the ER with the above complaints and given his risk factors hospitalist services were contacted for admission.  Patient is currently chest pain-free.  HOSPITAL COURSE:  Patient was admitted to telemetry.  Serial troponins were negative.  Patient was worked up with cardiac stress test to assess for ischemia left. Pharmacological myocardial perfusion imaging study with no significant  Ischemia Very small region of fixed  defect mild intensity in the mid anterior wall , unable to exclude prior infarct vs attenuation artifact Normal wall motion, EF estimated at 64% No EKG changes concerning for ischemia at peak stress or in recovery. Low risk scan Patient was also worked up with Doppler ultrasound of lower extremities which revealed no DVT Patient's chest pain completely resolved CONSULTS OBTAINED:    DRUG ALLERGIES:  No Known Allergies  DISCHARGE MEDICATIONS:   Allergies as of 10/09/2017   No Known Allergies     Medication List    TAKE these medications   aspirin EC 81 MG tablet Take 1 tablet (81 mg total) by mouth daily.   atenolol 100 MG tablet Commonly known as:  TENORMIN Take 1 tablet (100 mg total) by mouth daily.   Flaxseed Oil 1000 MG Caps Take 1 capsule (1,000 mg total) by mouth daily. Notes to patient:  NONE GIVEN TODAY   lisinopril 20 MG tablet Commonly known as:  PRINIVIL,ZESTRIL Take 1 tablet (20 mg total) by mouth daily.   simvastatin 40 MG tablet Commonly known as:  ZOCOR Take 1 tablet (40 mg total) by mouth at bedtime.       Today  Patient seen and evaluated today No chest pain No shortness of breath Tolerating diet well  VITAL SIGNS:  Blood pressure (!) 150/107, pulse 78, temperature 98.1 F (36.7 C), temperature source Oral, resp. rate 18, height 5\' 8"  (1.727 m), weight 90.5 kg, SpO2 98 %.  I/O:    Intake/Output Summary (Last 24 hours) at 10/09/2017 1516 Last data filed at 10/09/2017 1240 Gross per 24 hour  Intake 0 ml  Output  1150 ml  Net -1150 ml    PHYSICAL EXAMINATION:  Physical Exam  GENERAL:  72 y.o.-year-old patient lying in the bed with no acute distress.  LUNGS: Normal breath sounds bilaterally, no wheezing, rales,rhonchi or crepitation. No use of accessory muscles of respiration.  CARDIOVASCULAR: S1, S2 normal. No murmurs, rubs, or gallops.  ABDOMEN: Soft, non-tender, non-distended. Bowel sounds present. No organomegaly or mass.   NEUROLOGIC: Moves all 4 extremities. PSYCHIATRIC: The patient is alert and oriented x 3.  SKIN: No obvious rash, lesion, or ulcer.   DATA REVIEW:   CBC Recent Labs  Lab 10/08/17 0747  WBC 6.7  HGB 14.3  HCT 42.4  PLT 190    Chemistries  Recent Labs  Lab 10/08/17 0747  NA 140  K 4.0  CL 103  CO2 30  GLUCOSE 134*  BUN 19  CREATININE 0.90  CALCIUM 9.0    Cardiac Enzymes Recent Labs  Lab 10/08/17 1559  Cody Herrera <0.03    Microbiology Results  No results found for this or any previous visit.  RADIOLOGY:  Dg Chest 2 View  Result Date: 10/08/2017 CLINICAL DATA:  Chest and back pain. EXAM: CHEST - 2 VIEW COMPARISON:  01/22/2018. FINDINGS: Cardiomegaly with mild pulmonary vascular prominence. No focal infiltrate. No pleural effusion or pneumothorax. No acute bony abnormality. Degenerative change thoracic spine. IMPRESSION: Cardiomegaly with mild pulmonary venous congestion. Electronically Signed   By: Cody Herrera  Register   On: 10/08/2017 08:43   Nm Myocar Multi W/spect Cody Herrera Motion / Ef  Result Date: 10/09/2017 Pharmacological myocardial perfusion imaging study with no significant  Ischemia Very small region of fixed defect mild intensity in the mid anterior wall , unable to exclude prior infarct vs attenuation artifact Normal wall motion, EF estimated at 64% No EKG changes concerning for ischemia at peak stress or in recovery. Low risk scan Signed, Cody Plants, MD, Ph.D Cody Herrera   US Venous Img Lower Bilateral  Result Date: 10/08/2017 CLINICAL DATA:  Bilateral lower extremity pain for the past week. Evaluate for DVT. EXAM: BILATERAL LOWER EXTREMITY VENOUS DOPPLER ULTRASOUND TECHNIQUE: Gray-scale sonography with graded compression, as well as color Doppler and duplex ultrasound were performed to evaluate the lower extremity deep venous systems from the level of the common femoral vein and including the common femoral, femoral, profunda femoral, popliteal and calf veins  including the posterior tibial, peroneal and gastrocnemius veins when visible. The superficial great saphenous vein was also interrogated. Spectral Doppler was utilized to evaluate flow at rest and with distal augmentation maneuvers in the common femoral, femoral and popliteal veins. COMPARISON:  None. FINDINGS: RIGHT LOWER EXTREMITY Common Femoral Vein: No evidence of thrombus. Normal compressibility, respiratory phasicity and response to augmentation. Saphenofemoral Junction: No evidence of thrombus. Normal compressibility and flow on color Doppler imaging. Profunda Femoral Vein: No evidence of thrombus. Normal compressibility and flow on color Doppler imaging. Femoral Vein: No evidence of thrombus. Normal compressibility, respiratory phasicity and response to augmentation. Popliteal Vein: No evidence of thrombus. Normal compressibility, respiratory phasicity and response to augmentation. Calf Veins: No evidence of thrombus. Normal compressibility and flow on color Doppler imaging. Superficial Great Saphenous Vein: No evidence of thrombus. Normal compressibility. Venous Reflux:  None. Other Findings:  None. LEFT LOWER EXTREMITY Common Femoral Vein: No evidence of thrombus. Normal compressibility, respiratory phasicity and response to augmentation. Saphenofemoral Junction: No evidence of thrombus. Normal compressibility and flow on color Doppler imaging. Profunda Femoral Vein: No evidence of thrombus. Normal compressibility and flow on color Doppler imaging.  Femoral Vein: No evidence of thrombus. Normal compressibility, respiratory phasicity and response to augmentation. Popliteal Vein: No evidence of thrombus. Normal compressibility, respiratory phasicity and response to augmentation. Calf Veins: No evidence of thrombus. Normal compressibility and flow on color Doppler imaging. Superficial Great Saphenous Vein: No evidence of thrombus. Normal compressibility. Venous Reflux:  None. Other Findings:  None.  IMPRESSION: No evidence of DVT within either lower extremity. Electronically Signed   By: Sandi Mariscal M.D.   On: 10/08/2017 09:02   Ct Angio Chest/abd/pel For Dissection W And/or Wo Contrast  Result Date: 10/08/2017 CLINICAL DATA:  Chest tightness and pain radiating to legs. History of coronary stents. EXAM: CT ANGIOGRAPHY CHEST, ABDOMEN AND PELVIS TECHNIQUE: Multidetector CT imaging through the chest, abdomen and pelvis was performed using the standard protocol during bolus administration of intravenous contrast. Multiplanar reconstructed images and MIPs were obtained and reviewed to evaluate the vascular anatomy. CONTRAST:  152mL ISOVUE-370 IOPAMIDOL (ISOVUE-370) INJECTION 76% COMPARISON:  None. FINDINGS: CTA CHEST FINDINGS Cardiovascular: Heart size normal. No pericardial effusion. Satisfactory opacification of pulmonary arteries noted, and there is no evidence of pulmonary emboli. Coronary calcifications/stent. Dilated ascending aorta up to 4.5 cm transverse diameter. There is good contrast opacification of the aorta. No dissection or stenosis. Classic 3 vessel brachiocephalic arterial origin anatomy without proximal stenosis. Mild calcified atheromatous plaque in the descending segment. Mediastinum/Nodes: No hilar or mediastinal adenopathy. Lungs/Pleura: No pleural effusion.  No pneumothorax.  Lungs clear. Musculoskeletal: Schmorl's nodes in the lower thoracic spine. No fracture or worrisome bone lesion. Review of the MIP images confirms the above findings. CTA ABDOMEN AND PELVIS FINDINGS VASCULAR Aorta: No aneurysm, dissection, or stenosis. Scattered calcified atheromatous plaque. Celiac: Patent without evidence of aneurysm, dissection, vasculitis or significant stenosis. SMA: Patent without evidence of aneurysm, dissection, vasculitis or significant stenosis. Renals: Single right renal artery, widely patent. Duplicated left renal arteries, superior dominant, both patent. IMA: Patent without evidence of  aneurysm, dissection, vasculitis or significant stenosis. Inflow: Minimal plaque in the right common iliac artery. Mild tortuosity. No aneurysm, dissection, or stenosis. Visualized proximal outflow patent. Veins: No obvious venous abnormality within the limitations of this arterial phase study. Review of the MIP images confirms the above findings. NON-VASCULAR Hepatobiliary: No focal liver abnormality is seen. No gallstones, gallbladder wall thickening, or biliary dilatation. Pancreas: Unremarkable. No pancreatic ductal dilatation or surrounding inflammatory changes. Spleen: Normal in size.  3.6 cm peripherally calcified splenic cyst. Adrenals/Urinary Tract: Adrenal glands are unremarkable. Kidneys are normal, without renal calculi, focal lesion, or hydronephrosis. Bladder is unremarkable. Stomach/Bowel: 5 cm diverticulum projecting posteriorly from the gastric fundus. The stomach is decompressed. Small bowel is nondilated. Terminal ileum unremarkable. Normal appendix. Scattered diverticula from descending colon without significant adjacent inflammatory/edematous change. Lymphatic: No abdominal or pelvic adenopathy. Reproductive: Prostatic enlargement with central coarse calcifications. Other: No ascites.  No free air. 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 No acute fracture or worrisome bone lesion. Review of the MIP images confirms the above findings. IMPRESSION: 1. Coronary and Aortic Atherosclerosis (ICD10-170.0) without dissection. 2. 4.5 cm ascending thoracic aortic aneurysm (ICD10-I71.9). Recommend semi-annual imaging followup by CTA or MRA and referral to cardiothoracic surgery if not already obtained. This recommendation follows 2010 ACCF/AHA/AATS/ACR/ASA/SCA/SCAI/SIR/STS/SVM Guidelines for the Diagnosis and Management of Patients With Thoracic Aortic Disease. Circulation. 2010; 121: G867-Y195 3. Descending colon diverticulosis. 4. 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 5. Prostatic enlargement 6. 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 Electronically Signed   By: Eden Emms.D.  On: 10/08/2017 10:38    Follow up with PCP in 1 week.  Management plans discussed with the patient, family and they are in agreement.  CODE STATUS: Full code    Code Status Orders  (From admission, onward)         Start     Ordered   10/08/17 1213  Full code  Continuous     10/08/17 1212        Code Status History    This patient has a current code status but no historical code status.      TOTAL TIME TAKING CARE OF THIS PATIENT ON DAY OF DISCHARGE: more than 34 minutes.   Saundra Shelling M.D on 10/09/2017 at 3:16 PM  Between 7am to 6pm - Pager - [phone]  After 6pm go to [email or web address] - password [email or web address] [facility]  Office  [phone]  CC: Primary care physician; Cody Castle, MD  Note: This dictation was prepared with Dragon dictation along with smaller phrase technology. Any transcriptional errors that result from this process are unintentional.

## 2017-10-09 NOTE — Progress Notes (Signed)
Orderly explained to patient that he was going to take him to stress test.  Patient started arguing about the test and stating he was going home.  Orderly got me.  I asked patient if he was refusing the test.  He said "not if it was being done immediately".  I told him the orderly was trying to take him down for the test.  He agreed to go but was annoyed that he was being transported in his bed.

## 2018-01-08 ENCOUNTER — Emergency Department
Admission: EM | Admit: 2018-01-08 | Discharge: 2018-01-09 | Disposition: A | Discharge status: Home / Self Care | Payer: Medicare HMO | Attending: Emergency Medicine | Admitting: Emergency Medicine

## 2018-01-08 ENCOUNTER — Encounter: Payer: Self-pay | Admitting: Emergency Medicine

## 2018-01-08 ENCOUNTER — Other Ambulatory Visit: Payer: Self-pay

## 2018-01-08 DIAGNOSIS — Z79899 Other long term (current) drug therapy: Secondary | ICD-10-CM | POA: Insufficient documentation

## 2018-01-08 DIAGNOSIS — M5432 Sciatica, left side: Secondary | ICD-10-CM

## 2018-01-08 DIAGNOSIS — M79605 Pain in left leg: Secondary | ICD-10-CM | POA: Diagnosis present

## 2018-01-08 DIAGNOSIS — I10 Essential (primary) hypertension: Secondary | ICD-10-CM | POA: Diagnosis not present

## 2018-01-08 HISTORY — DX: Hyperlipidemia, unspecified: E78.5

## 2018-01-08 MED ORDER — DIAZEPAM 2 MG PO TABS
2.0000 mg | ORAL_TABLET | Freq: Once | ORAL | Status: AC
  Administered 2018-01-08: 2 mg via ORAL
  Filled 2018-01-08: qty 1

## 2018-01-08 MED ORDER — OXYCODONE HCL 5 MG PO TABS
5.0000 mg | ORAL_TABLET | Freq: Once | ORAL | Status: AC
  Administered 2018-01-08: 5 mg via ORAL
  Filled 2018-01-08: qty 1

## 2018-01-08 MED ORDER — ORPHENADRINE CITRATE 30 MG/ML IJ SOLN
60.0000 mg | Freq: Two times a day (BID) | INTRAMUSCULAR | Status: DC
  Administered 2018-01-08: 60 mg via INTRAMUSCULAR
  Filled 2018-01-08: qty 2

## 2018-01-08 MED ORDER — KETOROLAC TROMETHAMINE 30 MG/ML IJ SOLN
30.0000 mg | Freq: Once | INTRAMUSCULAR | Status: AC
  Administered 2018-01-08: 30 mg via INTRAMUSCULAR
  Filled 2018-01-08: qty 1

## 2018-01-08 MED ORDER — DIAZEPAM 2 MG PO TABS: 2.0000 mg | ORAL_TABLET | Freq: Three times a day (TID) | ORAL | 0 refills | Status: DC | PRN

## 2018-01-08 MED ORDER — METHYLPREDNISOLONE SODIUM SUCC 125 MG IJ SOLR
125.0000 mg | Freq: Once | INTRAMUSCULAR | Status: AC
  Administered 2018-01-08: 125 mg via INTRAMUSCULAR

## 2018-01-08 MED ORDER — PREDNISONE 10 MG (21) PO TBPK: ORAL_TABLET | ORAL | 0 refills | Status: DC

## 2018-01-08 MED ORDER — METHYLPREDNISOLONE SODIUM SUCC 125 MG IJ SOLR
INTRAMUSCULAR | Status: AC
  Filled 2018-01-08: qty 2

## 2018-01-08 MED ORDER — ORPHENADRINE CITRATE 30 MG/ML IJ SOLN
INTRAMUSCULAR | Status: AC
  Filled 2018-01-08: qty 2

## 2018-01-08 MED ORDER — HYDROCODONE-ACETAMINOPHEN 5-325 MG PO TABS: 1.0000 | ORAL_TABLET | Freq: Four times a day (QID) | ORAL | 0 refills | Status: AC | PRN

## 2018-01-08 NOTE — Discharge Instructions (Signed)
Please call your primary care provider as well as Dr. Saralyn Pilar office for appointments.  Return to the ER for symptoms of concern if unable to schedule an appointment.

## 2018-01-08 NOTE — ED Notes (Signed)
Assessment: pt states he is not able to bear weight on left leg. Pt states "you all need to get on the same page and stop asking me why i'm here." pt declines to elaborate further on pain or circumstances surrounding ed visit.

## 2018-01-08 NOTE — ED Notes (Signed)
Report given to Jenny Reichmann, Therapist, sports. Moving pt to room 4, charge RN aware.

## 2018-01-08 NOTE — ED Provider Notes (Signed)
[facility] Emergency Department Provider Note ____________________________________________  Time seen: Approximately 9:26 PM  I have reviewed the triage vital signs and the nursing notes.  HISTORY  Chief Complaint Leg Pain   HPI Cody Herrera is a 72 y.o. male who presents to the emergency department for treatment and evaluation of left hip pain that radiates into the left lower extremity down to his ankle.  Pain started this afternoon.  Patient states "I think I have sciatica."  He reports having the same thing in the distant past but it was not this bad.  He denies any recent injury.  He denies any urinary symptoms.  He has taken a "600 mg tablet of arthritis medicine, but it did not work."  Patient denies any urinary changes, incontinence, or bowel changes.  He denies any saddle anesthesia.  He denies fever. Past Medical History:  Diagnosis Date  . Hypercholesteremia   . Hyperlipidemia   . Hypertension     Patient Active Problem List   Diagnosis Date Noted  . Chest pain 10/08/2017    History reviewed. No pertinent surgical history.  Prior to Admission medications   Medication Sig Start Date End Date Taking? Authorizing Provider  atenolol (TENORMIN) 100 MG tablet Take 1 tablet (100 mg total) by mouth daily. 10/09/17 11/08/17  Saundra Shelling, MD  diazepam (VALIUM) 2 MG tablet Take 1 tablet (2 mg total) by mouth every 8 (eight) hours as needed. 01/08/18   Shaquelle Hernon, Johnette Abraham B, FNP  HYDROcodone-acetaminophen (NORCO/VICODIN) 5-325 MG tablet Take 1 tablet by mouth every 6 (six) hours as needed for up to 3 days for severe pain. 01/08/18 01/11/18  Zian Delair, Johnette Abraham B, FNP  lisinopril (PRINIVIL,ZESTRIL) 20 MG tablet Take 1 tablet (20 mg total) by mouth daily. 10/09/17 11/08/17  Saundra Shelling, MD  predniSONE (STERAPRED UNI-PAK 21 TAB) 10 MG (21) TBPK tablet Take 6 tablets on the first day and decrease by 1 tablet each day until finished. 01/08/18   Avriana Joo, Johnette Abraham B,  FNP  simvastatin (ZOCOR) 40 MG tablet Take 1 tablet (40 mg total) by mouth at bedtime. 10/09/17 11/08/17  Saundra Shelling, MD    Allergies Patient has no known allergies.  Family History  Problem Relation Age of Onset  . Heart attack Mother   . Heart attack Father     Social History Social History   Tobacco Use  . Smoking status: Never Smoker  . Smokeless tobacco: Never Used  Substance Use Topics  . Alcohol use: Yes    Frequency: Never    Comment: ocassinally  . Drug use: Never    Review of Systems Constitutional: Well appearing. Respiratory: Negative for dyspnea. Cardiovascular: Negative for change in skin temperature or color. Musculoskeletal:   Negative for chronic steroid use   Negative for trauma in the presence of osteoporosis  Negative for age over 20 and trauma.  Negative for constitutional symptoms, or history of cancer   Negative for pain worse at night. Skin: Negative for rash, lesion, or wound.  Genitourinary: Negative for urinary retention. Rectal: Negative for fecal incontinence or new onset constipation/bowel habit changes. Hematological/Immunilogical: Negative for immunosuppression, IV drug use, or fever Neurological: Positive for burning, tingling, numb, electric, radiating pain in the left lower extremity.                        Negative for saddle anesthesia.  Negative for focal neurologic deficit, progressive or disabling symptoms             Negative for saddle anesthesia. ____________________________________________   PHYSICAL EXAM:  VITAL SIGNS: ED Triage Vitals  Enc Vitals Group     BP 01/08/18 2019 136/85     Pulse Rate 01/08/18 2019 88     Resp 01/08/18 2019 18     Temp 01/08/18 2019 97.9 F (36.6 C)     Temp Source 01/08/18 2019 Oral     SpO2 01/08/18 2019 100 %     Weight 01/08/18 2018 195 lb (88.5 kg)     Height 01/08/18 2018 5\' 8"  (1.727 m)     Head Circumference --      Peak Flow --      Pain Score  01/08/18 2018 8     Pain Loc --      Pain Edu? --      Excl. in [place]? --     Constitutional: Alert and oriented. Well appearing and in no acute distress. Eyes: Conjunctivae are clear without discharge or drainage.  Head: Atraumatic. Neck: Full, active range of motion. Respiratory: Respirations even and unlabored. Musculoskeletal: Limited ROM of the back and extremities secondary to pain, Strength 5/5 of the lower extremities as tested. Neurologic: Reflexes of the lower extremities are 2+.  Uncooperative with straight leg testing. Skin: Atraumatic.  Psychiatric: Behavior and affect are normal.  ____________________________________________   LABS (all labs ordered are listed, but only abnormal results are displayed)  Labs Reviewed - No data to display ____________________________________________  RADIOLOGY  Not indicated ____________________________________________   PROCEDURES  Procedure(s) performed:  Procedures ____________________________________________   INITIAL IMPRESSION / ASSESSMENT AND PLAN / ED COURSE  Cody Herrera is a 72 y.o. male who presents to the emergency department for treatment and evaluation of left hip and leg pain that radiates down his left leg.  Patient is uncooperative with any extensive exam.  He refuses to attempt to stand, he refuses to participate in full exam.  IM Solu-Medrol, IM Norflex, and p.o. Roxicet IR has been ordered.  Once these medications have a chance to begin working, I will go back into the room and attempt a more thorough exam.  ----------------------------------------- 10:22 PM on 01/08/2018 -----------------------------------------  Patient somewhat improved after medications.  He was able to stand for very brief period of time but pain returned immediately upon standing.  A second dose of Roxicet IR was ordered and ice pack was applied to the left hip.  Patient states that he has been raking leaves and believes that  that is what has caused the pain.  11:15 PM on 01/08/2018  Patient moved to room 4 for further evaluation and treatment.  Care assumed by Dr. Owens Shark who will reevaluate the patient after he receives Toradol and Valium.  Plan at this point will be to discharge him home with prednisone, oxycodone, and Valium.  He is to call his primary care provider on Monday to schedule an appointment.  Information for Dr. Sharlet Salina with physiatry was also provided.  Medications  methylPREDNISolone sodium succinate (SOLU-MEDROL) 125 mg/2 mL injection 125 mg (125 mg Intramuscular Given 01/08/18 2110)  oxyCODONE (Oxy IR/ROXICODONE) immediate release tablet 5 mg (5 mg Oral Given 01/08/18 2110)  oxyCODONE (Oxy IR/ROXICODONE) immediate release tablet 5 mg (5 mg Oral Given 01/08/18 2224)  ketorolac (TORADOL) 30 MG/ML injection 30 mg (30 mg Intramuscular Given 01/08/18 2341)  diazepam (VALIUM) tablet 2 mg (2 mg Oral Given  01/08/18 2342)  cyclobenzaprine (FLEXERIL) tablet 10 mg (10 mg Oral Given 01/09/18 0110)    ED Discharge Orders         Ordered    predniSONE (STERAPRED UNI-PAK 21 TAB) 10 MG (21) TBPK tablet     01/08/18 2318    diazepam (VALIUM) 2 MG tablet  Every 8 hours PRN     01/08/18 2318    HYDROcodone-acetaminophen (NORCO/VICODIN) 5-325 MG tablet  Every 6 hours PRN     01/08/18 2318           Pertinent labs & imaging results that were available during my care of the patient were reviewed by me and considered in my medical decision making (see chart for details).  _________________________________________   FINAL CLINICAL IMPRESSION(S) / ED DIAGNOSES  Final diagnoses:  Sciatica of left side     If controlled substance prescribed during this visit, 12 month history viewed on the Organ prior to issuing an initial prescription for Schedule II or III opiod.    Victorino Dike, FNP 01/09/18 Vernelle Emerald    Schuyler Amor, MD 01/13/18 [phone]

## 2018-01-08 NOTE — ED Triage Notes (Signed)
Pt arrives POV to triage with c/o left hip and leg pain which radiates down his left leg. Pt states that he woke up with this pain and denies any injury or trauma. Pt is in NAD.

## 2018-01-09 MED ORDER — LIDOCAINE 5 % EX PTCH
1.0000 | MEDICATED_PATCH | CUTANEOUS | Status: DC
  Administered 2018-01-09: 1 via TRANSDERMAL
  Filled 2018-01-09: qty 1

## 2018-01-09 MED ORDER — CYCLOBENZAPRINE HCL 10 MG PO TABS
10.0000 mg | ORAL_TABLET | Freq: Once | ORAL | Status: AC
  Administered 2018-01-09: 10 mg via ORAL
  Filled 2018-01-09: qty 1

## 2018-12-11 ENCOUNTER — Inpatient Hospital Stay
Admission: EM | Admit: 2018-12-11 | Discharge: 2018-12-12 | DRG: 983 | Disposition: A | Discharge status: Home / Self Care | Payer: Medicare HMO | Attending: Internal Medicine | Admitting: Internal Medicine

## 2018-12-11 ENCOUNTER — Emergency Department: Payer: Medicare HMO

## 2018-12-11 ENCOUNTER — Other Ambulatory Visit: Payer: Self-pay

## 2018-12-11 ENCOUNTER — Encounter
Admission: EM | Disposition: A | Discharge status: Home / Self Care | Payer: Self-pay | Source: Home / Self Care | Attending: Internal Medicine

## 2018-12-11 DIAGNOSIS — I2510 Atherosclerotic heart disease of native coronary artery without angina pectoris: Secondary | ICD-10-CM | POA: Diagnosis present

## 2018-12-11 DIAGNOSIS — K922 Gastrointestinal hemorrhage, unspecified: Secondary | ICD-10-CM | POA: Diagnosis present

## 2018-12-11 DIAGNOSIS — I10 Essential (primary) hypertension: Secondary | ICD-10-CM | POA: Diagnosis present

## 2018-12-11 DIAGNOSIS — E785 Hyperlipidemia, unspecified: Secondary | ICD-10-CM | POA: Diagnosis present

## 2018-12-11 DIAGNOSIS — K5733 Diverticulitis of large intestine without perforation or abscess with bleeding: Principal | ICD-10-CM | POA: Diagnosis present

## 2018-12-11 DIAGNOSIS — Z20828 Contact with and (suspected) exposure to other viral communicable diseases: Secondary | ICD-10-CM | POA: Diagnosis present

## 2018-12-11 DIAGNOSIS — Z79899 Other long term (current) drug therapy: Secondary | ICD-10-CM | POA: Diagnosis not present

## 2018-12-11 DIAGNOSIS — Z8249 Family history of ischemic heart disease and other diseases of the circulatory system: Secondary | ICD-10-CM | POA: Diagnosis not present

## 2018-12-11 HISTORY — PX: VISCERAL ANGIOGRAPHY: CATH118276

## 2018-12-11 LAB — CBC WITH DIFFERENTIAL/PLATELET
Abs Immature Granulocytes: 0.03 10*3/uL (ref 0.00–0.07)
Basophils Absolute: 0 10*3/uL (ref 0.0–0.1)
Basophils Relative: 0 %
Eosinophils Absolute: 0.1 10*3/uL (ref 0.0–0.5)
Eosinophils Relative: 1 %
HCT: 39.1 % (ref 39.0–52.0)
Hemoglobin: 13.1 g/dL (ref 13.0–17.0)
Immature Granulocytes: 0 %
Lymphocytes Relative: 25 %
Lymphs Abs: 1.8 10*3/uL (ref 0.7–4.0)
MCH: 30.9 pg (ref 26.0–34.0)
MCHC: 33.5 g/dL (ref 30.0–36.0)
MCV: 92.2 fL (ref 80.0–100.0)
Monocytes Absolute: 0.5 10*3/uL (ref 0.1–1.0)
Monocytes Relative: 7 %
Neutro Abs: 4.9 10*3/uL (ref 1.7–7.7)
Neutrophils Relative %: 67 %
Platelets: 190 10*3/uL (ref 150–400)
RBC: 4.24 MIL/uL (ref 4.22–5.81)
RDW: 11.9 % (ref 11.5–15.5)
WBC: 7.4 10*3/uL (ref 4.0–10.5)
nRBC: 0 % (ref 0.0–0.2)

## 2018-12-11 LAB — HEMOGLOBIN AND HEMATOCRIT, BLOOD
HCT: 42.4 % (ref 39.0–52.0)
HCT: 37.5 % — ABNORMAL LOW (ref 39.0–52.0)
HCT: 38.5 % — ABNORMAL LOW (ref 39.0–52.0)
Hemoglobin: 14 g/dL (ref 13.0–17.0)
Hemoglobin: 12.3 g/dL — ABNORMAL LOW (ref 13.0–17.0)
Hemoglobin: 12.6 g/dL — ABNORMAL LOW (ref 13.0–17.0)

## 2018-12-11 LAB — BASIC METABOLIC PANEL
Anion gap: 9 (ref 5–15)
BUN: 15 mg/dL (ref 8–23)
CO2: 26 mmol/L (ref 22–32)
Calcium: 8.7 mg/dL — ABNORMAL LOW (ref 8.9–10.3)
Chloride: 105 mmol/L (ref 98–111)
Creatinine, Ser: 0.79 mg/dL (ref 0.61–1.24)
GFR calc Af Amer: >60 mL/min (ref >60)
GFR calc non Af Amer: >60 mL/min (ref >60)
Glucose, Bld: 102 mg/dL — ABNORMAL HIGH (ref 70–99)
Potassium: 3.7 mmol/L (ref 3.5–5.1)
Sodium: 140 mmol/L (ref 135–145)

## 2018-12-11 LAB — COMPREHENSIVE METABOLIC PANEL
ALT: 34 U/L (ref 0–44)
AST: 25 U/L (ref 15–41)
Albumin: 4.4 g/dL (ref 3.5–5.0)
Alkaline Phosphatase: 71 U/L (ref 38–126)
Anion gap: 12 (ref 5–15)
BUN: 19 mg/dL (ref 8–23)
CO2: 29 mmol/L (ref 22–32)
Calcium: 9.6 mg/dL (ref 8.9–10.3)
Chloride: 101 mmol/L (ref 98–111)
Creatinine, Ser: 0.82 mg/dL (ref 0.61–1.24)
GFR calc Af Amer: >60 mL/min (ref >60)
GFR calc non Af Amer: >60 mL/min (ref >60)
Glucose, Bld: 117 mg/dL — ABNORMAL HIGH (ref 70–99)
Potassium: 3.6 mmol/L (ref 3.5–5.1)
Sodium: 142 mmol/L (ref 135–145)
Total Bilirubin: 0.7 mg/dL (ref 0.3–1.2)
Total Protein: 7.7 g/dL (ref 6.5–8.1)

## 2018-12-11 LAB — CBC
HCT: 45.2 % (ref 39.0–52.0)
Hemoglobin: 15 g/dL (ref 13.0–17.0)
MCH: 31.1 pg (ref 26.0–34.0)
MCHC: 33.2 g/dL (ref 30.0–36.0)
MCV: 93.6 fL (ref 80.0–100.0)
Platelets: 216 10*3/uL (ref 150–400)
RBC: 4.83 MIL/uL (ref 4.22–5.81)
RDW: 11.8 % (ref 11.5–15.5)
WBC: 8.7 10*3/uL (ref 4.0–10.5)
nRBC: 0 % (ref 0.0–0.2)

## 2018-12-11 LAB — HEMOGLOBIN A1C
Hgb A1c MFr Bld: 5.7 % — ABNORMAL HIGH (ref 4.8–5.6)
Mean Plasma Glucose: 116.89 mg/dL

## 2018-12-11 LAB — SARS CORONAVIRUS 2 BY RT PCR (HOSPITAL ORDER, PERFORMED IN ~~LOC~~ HOSPITAL LAB): SARS Coronavirus 2: NEGATIVE

## 2018-12-11 LAB — PROTIME-INR
INR: 1.1 (ref 0.8–1.2)
Prothrombin Time: 13.8 seconds (ref 11.4–15.2)

## 2018-12-11 LAB — MRSA PCR SCREENING: MRSA by PCR: NEGATIVE

## 2018-12-11 LAB — GLUCOSE, CAPILLARY: Glucose-Capillary: 120 mg/dL — ABNORMAL HIGH (ref 70–99)

## 2018-12-11 LAB — TSH: TSH: 8.169 u[IU]/mL — ABNORMAL HIGH (ref 0.350–4.500)

## 2018-12-11 LAB — ABO/RH: ABO/RH(D): O POS

## 2018-12-11 SURGERY — VISCERAL ANGIOGRAPHY
Anesthesia: Moderate Sedation | Laterality: Bilateral

## 2018-12-11 MED ORDER — ONDANSETRON HCL 4 MG/2ML IJ SOLN
4.0000 mg | Freq: Four times a day (QID) | INTRAMUSCULAR | Status: DC | PRN
  Filled 2018-12-11: qty 2

## 2018-12-11 MED ORDER — GATIFLOXACIN 0.5 % OP SOLN
1.0000 [drp] | Freq: Three times a day (TID) | OPHTHALMIC | Status: DC
  Filled 2018-12-11: qty 2.5

## 2018-12-11 MED ORDER — METOPROLOL TARTRATE 5 MG/5ML IV SOLN: 5.0000 mg | INTRAVENOUS | Status: DC | PRN

## 2018-12-11 MED ORDER — PANTOPRAZOLE SODIUM 40 MG IV SOLR
40.0000 mg | INTRAVENOUS | Status: DC
  Filled 2018-12-11: qty 40

## 2018-12-11 MED ORDER — SODIUM CHLORIDE 0.9 % IV SOLN
INTRAVENOUS | Status: DC
  Administered 2018-12-11: 12:00:00 via INTRAVENOUS
  Administered 2018-12-12: 75 mL/h via INTRAVENOUS

## 2018-12-11 MED ORDER — IOHEXOL 350 MG/ML SOLN
125.0000 mL | Freq: Once | INTRAVENOUS | Status: AC | PRN
  Administered 2018-12-11: 125 mL via INTRAVENOUS

## 2018-12-11 MED ORDER — FENTANYL CITRATE (PF) 100 MCG/2ML IJ SOLN
INTRAMUSCULAR | Status: AC
  Filled 2018-12-11: qty 2

## 2018-12-11 MED ORDER — IODIXANOL 320 MG/ML IV SOLN
INTRAVENOUS | Status: DC | PRN
  Administered 2018-12-11: 95 mL via INTRA_ARTERIAL

## 2018-12-11 MED ORDER — DIFLUPREDNATE 0.05 % OP EMUL: 1.0000 [drp] | Freq: Three times a day (TID) | OPHTHALMIC | Status: DC

## 2018-12-11 MED ORDER — MIDAZOLAM HCL 5 MG/5ML IJ SOLN
INTRAMUSCULAR | Status: AC
  Filled 2018-12-11: qty 5

## 2018-12-11 MED ORDER — FENTANYL CITRATE (PF) 100 MCG/2ML IJ SOLN
INTRAMUSCULAR | Status: DC | PRN
  Administered 2018-12-11: 50 ug via INTRAVENOUS

## 2018-12-11 MED ORDER — MIDAZOLAM HCL 2 MG/2ML IJ SOLN
INTRAMUSCULAR | Status: DC | PRN
  Administered 2018-12-11: 2 mg via INTRAVENOUS
  Administered 2018-12-11: 1 mg via INTRAVENOUS

## 2018-12-11 MED ORDER — SODIUM CHLORIDE FLUSH 0.9 % IV SOLN
INTRAVENOUS | Status: AC
  Filled 2018-12-11: qty 30

## 2018-12-11 MED ORDER — KETOROLAC TROMETHAMINE 0.5 % OP SOLN
1.0000 [drp] | Freq: Every day | OPHTHALMIC | Status: DC
  Filled 2018-12-11: qty 3

## 2018-12-11 MED ORDER — ONDANSETRON HCL 4 MG PO TABS: 4.0000 mg | ORAL_TABLET | Freq: Four times a day (QID) | ORAL | Status: DC | PRN

## 2018-12-11 MED ORDER — CEFAZOLIN SODIUM-DEXTROSE 1-4 GM/50ML-% IV SOLN
1.0000 g | Freq: Once | INTRAVENOUS | Status: AC
  Administered 2018-12-11: 1 g via INTRAVENOUS

## 2018-12-11 MED ORDER — CHLORHEXIDINE GLUCONATE CLOTH 2 % EX PADS
6.0000 | MEDICATED_PAD | Freq: Every day | CUTANEOUS | Status: DC
  Administered 2018-12-11: 6 via TOPICAL

## 2018-12-11 MED ORDER — SODIUM CHLORIDE 0.9 % IV BOLUS
1000.0000 mL | Freq: Once | INTRAVENOUS | Status: AC
  Administered 2018-12-11: 1000 mL via INTRAVENOUS

## 2018-12-11 MED ORDER — SODIUM CHLORIDE 0.9 % IV SOLN: 10.0000 mL/h | Freq: Once | INTRAVENOUS | Status: DC

## 2018-12-11 SURGICAL SUPPLY — 14 items
BLOCK BEAD 500-700 (Vascular Products) ×2 IMPLANT
CATH MICROCATH PRGRT 2.8F 110 (CATHETERS) ×1 IMPLANT
CATH PIG 70CM (CATHETERS) ×2 IMPLANT
CATH VS15FR (CATHETERS) ×2 IMPLANT
COVER PROBE U/S 5X48 (MISCELLANEOUS) ×2 IMPLANT
DEVICE STARCLOSE SE CLOSURE (Vascular Products) ×2 IMPLANT
DEVICE TORQUE .025-.038 (MISCELLANEOUS) ×2 IMPLANT
GUIDEWIRE ANGLED .035 180CM (WIRE) ×2 IMPLANT
MICROCATH PROGREAT 2.8F 110 CM (CATHETERS) ×2
PACK ANGIOGRAPHY (CUSTOM PROCEDURE TRAY) ×2 IMPLANT
SHEATH BRITE TIP 5FRX11 (SHEATH) ×2 IMPLANT
SYR MEDRAD MARK 7 150ML (SYRINGE) ×2 IMPLANT
TUBING CONTRAST HIGH PRESS 72 (TUBING) ×2 IMPLANT
WIRE J 3MM .035X145CM (WIRE) ×2 IMPLANT

## 2018-12-11 NOTE — Consult Note (Signed)
Name: Corin Cuzzort MRN: [id number] DOB: 1945-02-27    ADMISSION DATE:  12/11/2018 CONSULTATION DATE: 12/11/2018  REFERRING MD : Dr. 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  He presented to [facility] ER on 10/31 with episodes of bright red bleeding per rectum onset of symptoms 3 days prior to presentation. In the ER pt had 2 episodes of bright red bleeding per rectum.  Lab results revealed hgb 15.0.  CTA Abd/Pelvis concerning for active arterial extravasation involving the proximal descending colon.  General Surgery evaluated pt in the ER and pt scheduled to undergo mesenteric angiography and possible micro bead embolization on 10/31.  He was subsequently admitted to the stepdown unit per hospitalist team for additional workup and treatment.   PAST MEDICAL HISTORY :   has a past medical history of Hypercholesteremia, Hyperlipidemia, and Hypertension.  has a past surgical history that includes Abdominal surgery. Prior to Admission medications   Medication Sig Start Date End Date Taking? Authorizing Provider  atenolol (TENORMIN) 100 MG tablet Take 100 mg by mouth daily.   Yes [provider]  BESIVANCE 0.6 % SUSP Place 1 drop into the left eye 3 (three) times daily. 11/12/18  Yes [provider]  DUREZOL 0.05 % EMUL Place 1 drop into the left eye 3 (three) times daily. 11/12/18  Yes [provider]  Flaxseed, Linseed, (FLAX SEEDS PO) Take 1 capsule by mouth daily.   Yes  [provider]  lisinopril (ZESTRIL) 20 MG tablet Take 20 mg by mouth daily.   Yes [provider]  Multiple Vitamin (MULTIVITAMIN WITH MINERALS) TABS tablet Take 1 tablet by mouth daily.   Yes [provider]  PROLENSA 0.07 % SOLN Place 1 drop into the left eye at bedtime. 11/12/18  Yes [provider]  simvastatin (ZOCOR) 40 MG tablet Take 40 mg by mouth at bedtime.   Yes [provider]   No Known Allergies  FAMILY HISTORY:  family history includes Heart attack in his father and mother. SOCIAL HISTORY:  reports that he has never smoked. He has never used smokeless tobacco. He reports current alcohol use. He reports that he does not use drugs.  REVIEW OF SYSTEMS: Positives in BOLD   Constitutional: Negative for fever, chills, weight loss, malaise/fatigue and diaphoresis.  HENT: Negative for hearing loss, ear pain, nosebleeds, congestion, sore throat, neck pain, tinnitus and ear discharge.   Eyes: Negative for blurred vision, double vision, photophobia, pain, discharge and redness.  Respiratory: Negative for cough, hemoptysis, sputum production, shortness of breath, wheezing and stridor.   Cardiovascular: Negative for chest pain, palpitations, orthopnea, claudication, leg swelling and PND.  Gastrointestinal: bright red bleeding per rectum, heartburn, nausea, vomiting, abdominal pain, diarrhea, constipation, blood in stool and melena.  Genitourinary: Negative for dysuria, urgency, frequency, hematuria and flank pain.  Musculoskeletal: Negative for myalgias, back pain, joint pain and falls.  Skin: Negative for itching and rash.  Neurological: Negative for dizziness, tingling, tremors, sensory change, speech change, focal weakness, seizures, loss of consciousness, weakness and headaches.  Endo/Heme/Allergies:  Negative for environmental allergies and polydipsia. Does not bruise/bleed easily.  SUBJECTIVE:  No complaints at this time   VITAL  SIGNS: Temp:  [98.2 F (36.8 C)] 98.2 F (36.8 C) (10/31 0017) Pulse Rate:  [71-74] 74 (10/31 0130) Resp:  [13-18] 15 (10/31 0130) BP: (128-160)/(83-106) 128/94 (10/31 0130) SpO2:  [95 %-99 %] 98 % (10/31 0130) Weight:  [86.2 kg] 86.2 kg (10/31 0017)  PHYSICAL EXAMINATION: General: well developed, well nourished male, NAD  Neuro: alert and oriented, follows commands  HEENT: supple, no JVD  Cardiovascular: nsr, rrr, no R/G  Lungs: clear throughout, even, non labored  Abdomen: +BS x4, soft, non tender, non distended  Musculoskeletal: normal bulk and tone, no edema  Skin: intact no rashes or lesions present   Recent Labs  Lab 12/11/18 0025  NA 142  K 3.6  CL 101  CO2 29  BUN 19  CREATININE 0.82  GLUCOSE 117*   Recent Labs  Lab 12/11/18 0025 12/11/18 0153  HGB 15.0 14.0  HCT 45.2 42.4  WBC 8.7  --   PLT 216  --    Ct Angio Abd/pel W And/or Wo Contrast  Result Date: 12/11/2018 CLINICAL DATA:  Rectal bleeding EXAM: CTA ABDOMEN AND PELVIS WITHOUT AND WITH CONTRAST TECHNIQUE: Multidetector CT imaging of the abdomen and pelvis was performed using the standard protocol during bolus administration of intravenous contrast. Multiplanar reconstructed images and MIPs were obtained and reviewed to evaluate the vascular anatomy. CONTRAST:  131mL OMNIPAQUE IOHEXOL 350 MG/ML SOLN COMPARISON:  CT dated October 08, 2017. FINDINGS: VASCULAR Aorta: There are atherosclerotic changes of the abdominal aorta without evidence for an aneurysm or high-grade stenosis. Celiac: Patent without evidence of aneurysm, dissection, vasculitis or significant stenosis. SMA: Patent without evidence of aneurysm, dissection, vasculitis or significant stenosis. Renals: Both renal arteries are patent without evidence of aneurysm, dissection, vasculitis, fibromuscular dysplasia or significant stenosis. IMA: Patent without evidence of aneurysm, dissection, vasculitis or significant stenosis. Inflow: Patent without  evidence of aneurysm, dissection, vasculitis or significant stenosis. Proximal Outflow: Bilateral common femoral and visualized portions of the superficial and profunda femoral arteries are patent without evidence of aneurysm, dissection, vasculitis or significant stenosis. Veins: No obvious venous abnormality within the limitations of this arterial phase study. Review of the MIP images confirms the above findings. NON-VASCULAR Lower chest: The lung bases are clear. The heart size is normal. Hepatobiliary: The liver is normal. Normal gallbladder.There is no biliary ductal dilation. Pancreas: Normal contours without ductal dilatation. No peripancreatic fluid collection. 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 This is stable from prior study. Adrenals/Urinary Tract: --Adrenal glands: No adrenal hemorrhage. --Right kidney/ureter: No hydronephrosis or perinephric hematoma. --Left kidney/ureter: No hydronephrosis or perinephric hematoma. --Urinary bladder: There is asymmetric bladder wall thickening along the right lateral aspect of the urinary bladder wall. 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 --Small bowel: No dilatation or inflammation. --Colon: There is scattered colonic diverticula. There is evidence for active arterial extravasation at the level of the proximal descending colon. This is best visualized on the arterial coronal series 9, image 59 and venous coronal series 14, image 58. There is some mild wall thickening of the remaining portions of the colon which is presumably reactive. --Appendix: Normal. Lymphatic: --No retroperitoneal lymphadenopathy. --No mesenteric lymphadenopathy. --No pelvic or inguinal lymphadenopathy. Reproductive: The prostate gland is significantly enlarged. Other: No ascites or free air. The abdominal wall is normal. Musculoskeletal. No acute displaced fractures.  IMPRESSION:  1. The study is positive for active arterial extravasation involving the proximal descending colon. 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 2. Minimal atherosclerotic disease without evidence for an abdominal aortic aneurysm. 3. Asymmetric bladder wall thickening along the right lateral aspect of the urinary bladder wall. Correlation with outpatient cystoscopy is recommended. 4. Prostatomegaly. These results were called by telephone at the time of interpretation on 12/11/2018 at 1:39 am to provider [facility] , who verbally acknowledged these results. Electronically Signed   By: Constance Holster M.D.   On: 12/11/2018 01:42    ASSESSMENT / PLAN:  Acute GI bleed secondary to active arterial extravasation involving the proximal descending colon Serial H&H q6hrs  Monitor for s/sx of bleeding and transfuse for hgb <7 Will start NS @75  ml/hr  Maintain map 65 or greater  VTE px: SCD's, avoid chemical prophylaxis  SUP px: iv protonix  Keep NPO for now Hold outpatient po medications for now  General surgery consulted appreciate input-pt scheduled for mesenteric angiography and possible micro bead embolization on 10/31  Marda Stalker, [place] Pager [phone] (please enter 7 digits) PCCM Consult Pager [phone] (please enter 7 digits)

## 2018-12-11 NOTE — Progress Notes (Signed)
Patient refusing to wear EKG leads, BP cuff, SPO2 monitor, etc.  Telemetry staff notified.

## 2018-12-11 NOTE — ED Triage Notes (Addendum)
Patient c/o rectal bleeding. Patient reports 1 episode Thursday and bleeding again today. Patient reports heavy bleeding that is straight liquid. Patient reports hx of rectal bleeding in the past. Patient was dx with he believes and abdominal aneurysm; patient has surgery to repair and multiple blood transfusions during his hospitalization.

## 2018-12-11 NOTE — ED Notes (Addendum)
Pt has had 2 BM since arriving to room. Pt st "bright red blood".

## 2018-12-11 NOTE — Procedures (Addendum)
PREOPERATIVE DIAGNOSIS: Gastrointestinal bleed.   POSTOPERATIVE DIAGNOSIS:  Gastrointestinal bleed.   PROCEDURE PERFORMED:   1.  Selective injection of the superior mesenteric artery.  2.  Embolization of mesenteric branches using 500 to 700 micron PVC beads.   SURGEON: Icyss Skog M.D.   SEDATION: Versed 3 mg, plus fentanyl 50 mcg administered IV. Continuous ECG, pulse oximetry and cardiopulmonary monitoring was performed throughout the entire procedure by the interventional radiology nurse. Total sedation time was 43 minutes.   ACCESS:  A 5 French sheath, right common femoral artery.   FLUOROSCOPY TIME: 9.6 minutes.   CONTRAST USED: Isovue 95 mL.   INDICATIONS: Cody Herrera is a 73 year old gentleman who presented to the Emergency Room with GI bleed. While in the Emergency Room, he had multiple bowel movements, was admited to step down for observation. I have been contacted regarding embolization to stop the possible life-threatening hemorrhage. The patient underwent a CT scan which was positive for left upper quadrant splenic flexure bleed. The risks and benefits, as well as the current therapies were reviewed. Complications including hematoma at the groin site, contrast reactions and nephrotoxicity of the contrast, as well as potential damage and necrosis of colon were all reviewed. All questions were answered. The patient is in agreement with proceeding urgently.  He dropped 2 gm of Hg since admission, but remains hemodynamically stable.   DESCRIPTION OF PROCEDURE: The patient is taken to cath lab and placed in the supine position. After adequate sedation was achieved, 1gm of ancef IV, both groins are prepped and draped in sterile fashion. Ultrasound was placed in a sterile sleeve. Ultrasound is utilized secondary to lack of appropriate landmarks and to avoid vascular injury. Under direct ultrasound visualization, the common femoral artery is identified. It is echolucent and  pulsatile, indicating patency. Image is recorded for the permanent record, and access is obtained with a 18 gauge needle. J-wire followed by a 5 French sheath, 5 French pigtail catheter. The pigtail catheter is placed at T12 level and AP projection is obtained. This localizes the SMA and a lateral view was then obtained. Once the lateral view is reviewed, origin of the SMA is identified. It comes off at a very sharp angle, instead of slowly angling down as is normal. It comes straight off and then has several sharp angles. Then the catheter was seated into the SMA, selective imaging of several branches was obtained. The middle colic vessel and a left colic branch is accessed. When a blush was identified from the left colic vessel [id number] micron beads were  infused. total of 2 mL was injected into several different small terminal branches branches going into the splenic flexure of the colon. After each injection, contrast was reinjected to demonstrate that there was adequate persistent flow. Catheter was subsequently removed and oblique view of the right groin was obtained and a StarClose device deployed. There were no immediate complications.   INTERPRETATION: The patient's SMA is widely patent. It is somewhat difficult to negotiate with catheters and wires, as it has very sharp angles and, in fact, several of the branches had almost 360 degree loop or  very sharp hairpin turns. Ultimately, however, a successful embolization was achieved as in prior procedure conducted by Dr. Waldron Labs.  Blood loss: minimal Complications:  None

## 2018-12-11 NOTE — H&P (Signed)
Cody Herrera is an 73 y.o. male.   Chief Complaint: Rectal bleeding HPI: The patient with past medical history of GI bleed, hypertension, CAD and hyperlipidemia presents to the emergency department complaining of rectal bleeding.  The patient admits to bright red bleeding from his rectum 2 days ago.  He had a day free of bleeding but had recurrent episodes of bright red blood per rectum this evening prior to admission.  The patient denies abdominal pain, lightheadedness, dizziness or chest pain.  CTA of his abdomen revealed likely bleed of superior mesenteric artery.  The patient has had embolization of his upper GI vasculature in this area in the past.  He was evaluated by the vascular surgery service who agreed to attempt embolization today.  The hospitalist service was then called for medical management and coordination of care.  Past Medical History:  Diagnosis Date  . Hypercholesteremia   . Hyperlipidemia   . Hypertension     Past Surgical History:  Procedure Laterality Date  . ABDOMINAL SURGERY      Family History  Problem Relation Age of Onset  . Heart attack Mother   . Heart attack Father    Social History:  reports that he has never smoked. He has never used smokeless tobacco. He reports current alcohol use. He reports that he does not use drugs.  Allergies: No Known Allergies  Medications Prior to Admission  Medication Sig Dispense Refill  . atenolol (TENORMIN) 100 MG tablet Take 100 mg by mouth daily.    Marland Kitchen BESIVANCE 0.6 % SUSP Place 1 drop into the left eye 3 (three) times daily.    . DUREZOL 0.05 % EMUL Place 1 drop into the left eye 3 (three) times daily.    . Flaxseed, Linseed, (FLAX SEEDS PO) Take 1 capsule by mouth daily.    Marland Kitchen lisinopril (ZESTRIL) 20 MG tablet Take 20 mg by mouth daily.    . Multiple Vitamin (MULTIVITAMIN WITH MINERALS) TABS tablet Take 1 tablet by mouth daily.    Marland Kitchen PROLENSA 0.07 % SOLN Place 1 drop into the left eye at bedtime.    .  simvastatin (ZOCOR) 40 MG tablet Take 40 mg by mouth at bedtime.      Results for orders placed or performed during the hospital encounter of 12/11/18 (from the past 48 hour(s))  Comprehensive metabolic panel     Status: Abnormal   Collection Time: 12/11/18 12:25 AM  Result Value Ref Range   Sodium 142 135 - 145 mmol/L   Potassium 3.6 3.5 - 5.1 mmol/L   Chloride 101 98 - 111 mmol/L   CO2 29 22 - 32 mmol/L   Glucose, Bld 117 (H) 70 - 99 mg/dL   BUN 19 8 - 23 mg/dL   Creatinine, Ser 0.82 0.61 - 1.24 mg/dL   Calcium 9.6 8.9 - 10.3 mg/dL   Total Protein 7.7 6.5 - 8.1 g/dL   Albumin 4.4 3.5 - 5.0 g/dL   AST 25 15 - 41 U/L   ALT 34 0 - 44 U/L   Alkaline Phosphatase 71 38 - 126 U/L   Total Bilirubin 0.7 0.3 - 1.2 mg/dL   GFR calc non Af Amer >60 >60 mL/min   GFR calc Af Amer >60 >60 mL/min   Anion gap 12 5 - 15    Comment: Performed at [facility], [address], [place], [place] [address]  CBC     Status: None   Collection Time: 12/11/18 12:25 AM  Result  Value Ref Range   WBC 8.7 4.0 - 10.5 K/uL   RBC 4.83 4.22 - 5.81 MIL/uL   Hemoglobin 15.0 13.0 - 17.0 g/dL   HCT 45.2 39.0 - 52.0 %   MCV 93.6 80.0 - 100.0 fL   MCH 31.1 26.0 - 34.0 pg   MCHC 33.2 30.0 - 36.0 g/dL   RDW 11.8 11.5 - 15.5 %   Platelets 216 150 - 400 K/uL   nRBC 0.0 0.0 - 0.2 %    Comment: Performed at [facility], [address] [place], [place] [address]  Type and screen [place]     Status: None   Collection Time: 12/11/18 12:25 AM  Result Value Ref Range   ABO/RH(D) O POS    Antibody Screen NEG    Sample Expiration      12/14/2018,2359 Performed at [facility], [address] [place], [place] [address]   Hemoglobin and hematocrit, blood     Status: None   Collection Time: 12/11/18  1:53 AM  Result Value Ref Range   Hemoglobin 14.0 13.0 - 17.0 g/dL   HCT 42.4 39.0 - 52.0 %    Comment: Performed at [facility], [place], [place] [address]   Ct Angio Abd/pel W And/or Wo Contrast  Result Date: 12/11/2018 CLINICAL DATA:  Rectal bleeding EXAM: CTA ABDOMEN AND PELVIS WITHOUT AND WITH CONTRAST TECHNIQUE: Multidetector CT imaging of the abdomen and pelvis was performed using the standard protocol during bolus administration of intravenous contrast. Multiplanar reconstructed images and MIPs were obtained and reviewed to evaluate the vascular anatomy. CONTRAST:  152mL OMNIPAQUE IOHEXOL 350 MG/ML SOLN COMPARISON:  CT dated October 08, 2017. FINDINGS: VASCULAR Aorta: There are atherosclerotic changes of the abdominal aorta without evidence for an aneurysm or high-grade stenosis. Celiac: Patent without evidence of aneurysm, dissection, vasculitis or significant stenosis. SMA: Patent without evidence of aneurysm, dissection, vasculitis or significant stenosis. Renals: Both renal arteries are patent without evidence of aneurysm, dissection, vasculitis, fibromuscular dysplasia or significant stenosis. IMA: Patent without evidence of aneurysm, dissection, vasculitis or significant stenosis. Inflow: Patent without evidence of aneurysm, dissection, vasculitis or significant stenosis. Proximal Outflow: Bilateral common femoral and visualized portions of the superficial and profunda femoral arteries are patent without evidence of aneurysm, dissection, vasculitis or significant stenosis. Veins: No obvious venous abnormality within the limitations of this arterial phase study. Review of the MIP images confirms the above findings. NON-VASCULAR Lower chest: The lung bases are clear. The heart size is normal. Hepatobiliary: The liver is normal. Normal gallbladder.There is no biliary ductal dilation. Pancreas: Normal contours without ductal dilatation. No peripancreatic fluid collection. 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 This is stable from prior study. Adrenals/Urinary Tract:  --Adrenal glands: No adrenal hemorrhage. --Right kidney/ureter: No hydronephrosis or perinephric hematoma. --Left kidney/ureter: No hydronephrosis or perinephric hematoma. --Urinary bladder: There is asymmetric bladder wall thickening along the right lateral aspect of the urinary bladder wall. 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 --Small bowel: No dilatation or inflammation. --Colon: There is scattered colonic diverticula. There is evidence for active arterial extravasation at the level of the proximal descending colon. This is best visualized on the arterial coronal series 9, image 59 and venous coronal series 14, image 58. There is some mild wall thickening of the remaining portions of the colon which is presumably reactive. --Appendix: Normal. Lymphatic: --No retroperitoneal lymphadenopathy. --No mesenteric lymphadenopathy. --  No pelvic or inguinal lymphadenopathy. Reproductive: The prostate gland is significantly enlarged. Other: No ascites or free air. The abdominal wall is normal. Musculoskeletal. No acute displaced fractures. IMPRESSION: 1. The study is positive for active arterial extravasation involving the proximal descending colon. 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 2. Minimal atherosclerotic disease without evidence for an abdominal aortic aneurysm. 3. Asymmetric bladder wall thickening along the right lateral aspect of the urinary bladder wall. Correlation with outpatient cystoscopy is recommended. 4. Prostatomegaly. These results were called by telephone at the time of interpretation on 12/11/2018 at 1:39 am to provider [facility] , who verbally acknowledged these results. Electronically Signed   By: Constance Holster M.D.   On: 12/11/2018 01:42    Review of Systems  Constitutional: Negative for chills and fever.  HENT: Negative for sore throat and tinnitus.   Eyes: Negative for  blurred vision and redness.  Respiratory: Negative for cough and shortness of breath.   Cardiovascular: Negative for chest pain, palpitations, orthopnea and PND.  Gastrointestinal: Positive for blood in stool. Negative for abdominal pain, diarrhea, nausea and vomiting.  Genitourinary: Negative for dysuria, frequency and urgency.  Musculoskeletal: Negative for joint pain and myalgias.  Skin: Negative for rash.       No lesions  Neurological: Negative for speech change, focal weakness and weakness.  Endo/Heme/Allergies: Does not bruise/bleed easily.       No temperature intolerance  Psychiatric/Behavioral: Negative for depression and suicidal ideas.    Blood pressure (!) 128/94, pulse 74, temperature 98.2 F (36.8 C), temperature source Oral, resp. rate 15, height 5\' 7"  (1.702 m), weight 86.2 kg, SpO2 98 %. Physical Exam  Vitals reviewed. Constitutional: He is oriented to person, place, and time. He appears well-developed and well-nourished. No distress.  HENT:  Head: Normocephalic and atraumatic.  Mouth/Throat: Oropharynx is clear and moist.  Eyes: Pupils are equal, round, and reactive to light. Conjunctivae and EOM are normal. No scleral icterus.  Neck: Normal range of motion. Neck supple. No JVD present. No tracheal deviation present. No thyromegaly present.  Cardiovascular: Normal rate, regular rhythm and normal heart sounds. Exam reveals no gallop and no friction rub.  No murmur heard. Respiratory: Effort normal and breath sounds normal. No respiratory distress.  GI: Soft. Bowel sounds are normal. He exhibits no distension. There is no abdominal tenderness.  Genitourinary:    Genitourinary Comments: Deferred   Musculoskeletal: Normal range of motion.        General: No edema.  Lymphadenopathy:    He has no cervical adenopathy.  Neurological: He is alert and oriented to person, place, and time. No cranial nerve deficit.  Skin: Skin is warm and dry. No rash noted. No erythema.   Psychiatric: He has a normal mood and affect. His behavior is normal. Judgment and thought content normal.     Assessment/Plan This is a 73 year old male admitted for GI bleeding. 1.  GI bleed: Brisk, bright red blood per rectum.  Painless; currently stable.  CT imaging shows multiple areas of diverticulitis at splenic flexure.  Vascular surgery on board to perform embolization.  Consult general surgery for possible partial colectomy. 2.  Diverticulosis: No abdominal pain, fever or leukocytosis.  The patient received perioperative antibiotics.  If proceed with colectomy recommend gram-negative coverage. 3.  Coronary artery disease: Stable; hold anticoagulant until cleared postoperatively by surgery 4.  Hypertension: IV metoprolol for now to manage blood pressure prior to embolization and surgery.  Resume home antihypertensive medications once the patient is able to have oral intake. 5.  DVT prophylaxis: SCDs 6.  GI prophylaxis: Pantoprazole IV The patient is a full code.  I have personally spent 45 minutes in critical care time with this patient.  Harrie Foreman, MD 12/11/2018, 2:51 AM

## 2018-12-11 NOTE — ED Notes (Signed)
MD Diamond at bedside. 

## 2018-12-11 NOTE — Consult Note (Signed)
Reason for Consult:GI bleeding Referring Physician: Dr. Owens Shark Cody Herrera is an 73 y.o. male.  HPI: This is a 73 year old male with GI bleeding per rectum.  He has had bleeding per rectum starting Thursday with one episode and then Friday evening another episode.  He has had this last year and was seen and treated by Dr. Hortencia Pilar with embolization.  He was discharged after successful mesenteric microembolization.  He is hemodynamically stable in the ER.    Past Medical History:  Diagnosis Date  . Hypercholesteremia   . Hyperlipidemia   . Hypertension     Past Surgical History:  Procedure Laterality Date  . ABDOMINAL SURGERY      Family History  Problem Relation Age of Onset  . Heart attack Mother   . Heart attack Father     Social History:  reports that he has never smoked. He has never used smokeless tobacco. He reports current alcohol use. He reports that he does not use drugs.  Allergies: No Known Allergies  Medications: I have reviewed the patient's current medications.  Results for orders placed or performed during the hospital encounter of 12/11/18 (from the past 48 hour(s))  Comprehensive metabolic panel     Status: Abnormal   Collection Time: 12/11/18 12:25 AM  Result Value Ref Range   Sodium 142 135 - 145 mmol/L   Potassium 3.6 3.5 - 5.1 mmol/L   Chloride 101 98 - 111 mmol/L   CO2 29 22 - 32 mmol/L   Glucose, Bld 117 (H) 70 - 99 mg/dL   BUN 19 8 - 23 mg/dL   Creatinine, Ser 0.82 0.61 - 1.24 mg/dL   Calcium 9.6 8.9 - 10.3 mg/dL   Total Protein 7.7 6.5 - 8.1 g/dL   Albumin 4.4 3.5 - 5.0 g/dL   AST 25 15 - 41 U/L   ALT 34 0 - 44 U/L   Alkaline Phosphatase 71 38 - 126 U/L   Total Bilirubin 0.7 0.3 - 1.2 mg/dL   GFR calc non Af Amer >60 >60 mL/min   GFR calc Af Amer >60 >60 mL/min   Anion gap 12 5 - 15    Comment: Performed at [facility], [address] [place], [place] [address]  CBC     Status: None   Collection Time:  12/11/18 12:25 AM  Result Value Ref Range   WBC 8.7 4.0 - 10.5 K/uL   RBC 4.83 4.22 - 5.81 MIL/uL   Hemoglobin 15.0 13.0 - 17.0 g/dL   HCT 45.2 39.0 - 52.0 %   MCV 93.6 80.0 - 100.0 fL   MCH 31.1 26.0 - 34.0 pg   MCHC 33.2 30.0 - 36.0 g/dL   RDW 11.8 11.5 - 15.5 %   Platelets 216 150 - 400 K/uL   nRBC 0.0 0.0 - 0.2 %    Comment: Performed at [facility], [address] [place], [place] [address]  Type and screen [place]     Status: None   Collection Time: 12/11/18 12:25 AM  Result Value Ref Range   ABO/RH(D) O POS    Antibody Screen NEG    Sample Expiration      12/14/2018,2359 Performed at [facility], [address] [place], [place] [address]   Hemoglobin and hematocrit, blood     Status: None   Collection Time: 12/11/18  1:53 AM  Result Value Ref Range   Hemoglobin 14.0 13.0 - 17.0 g/dL  HCT 42.4 39.0 - 52.0 %    Comment: Performed at [facility], [place], [place] [address]    Ct Angio Abd/pel W And/or Wo Contrast  Result Date: 12/11/2018 CLINICAL DATA:  Rectal bleeding EXAM: CTA ABDOMEN AND PELVIS WITHOUT AND WITH CONTRAST TECHNIQUE: Multidetector CT imaging of the abdomen and pelvis was performed using the standard protocol during bolus administration of intravenous contrast. Multiplanar reconstructed images and MIPs were obtained and reviewed to evaluate the vascular anatomy. CONTRAST:  161mL OMNIPAQUE IOHEXOL 350 MG/ML SOLN COMPARISON:  CT dated October 08, 2017. FINDINGS: VASCULAR Aorta: There are atherosclerotic changes of the abdominal aorta without evidence for an aneurysm or high-grade stenosis. Celiac: Patent without evidence of aneurysm, dissection, vasculitis or significant stenosis. SMA: Patent without evidence of aneurysm, dissection, vasculitis or significant stenosis. Renals: Both renal arteries are patent without evidence of aneurysm, dissection, vasculitis, fibromuscular dysplasia or  significant stenosis. IMA: Patent without evidence of aneurysm, dissection, vasculitis or significant stenosis. Inflow: Patent without evidence of aneurysm, dissection, vasculitis or significant stenosis. Proximal Outflow: Bilateral common femoral and visualized portions of the superficial and profunda femoral arteries are patent without evidence of aneurysm, dissection, vasculitis or significant stenosis. Veins: No obvious venous abnormality within the limitations of this arterial phase study. Review of the MIP images confirms the above findings. NON-VASCULAR Lower chest: The lung bases are clear. The heart size is normal. Hepatobiliary: The liver is normal. Normal gallbladder.There is no biliary ductal dilation. Pancreas: Normal contours without ductal dilatation. No peripancreatic fluid collection. 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 This is stable from prior study. Adrenals/Urinary Tract: --Adrenal glands: No adrenal hemorrhage. --Right kidney/ureter: No hydronephrosis or perinephric hematoma. --Left kidney/ureter: No hydronephrosis or perinephric hematoma. --Urinary bladder: There is asymmetric bladder wall thickening along the right lateral aspect of the urinary bladder wall. 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 --Small bowel: No dilatation or inflammation. --Colon: There is scattered colonic diverticula. There is evidence for active arterial extravasation at the level of the proximal descending colon. This is best visualized on the arterial coronal series 9, image 59 and venous coronal series 14, image 58. There is some mild wall thickening of the remaining portions of the colon which is presumably reactive. --Appendix: Normal. Lymphatic: --No retroperitoneal lymphadenopathy. --No mesenteric lymphadenopathy. --No pelvic or inguinal lymphadenopathy. Reproductive: The prostate gland is  significantly enlarged. Other: No ascites or free air. The abdominal wall is normal. Musculoskeletal. No acute displaced fractures. IMPRESSION: 1. The study is positive for active arterial extravasation involving the proximal descending colon. 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 2. Minimal atherosclerotic disease without evidence for an abdominal aortic aneurysm. 3. Asymmetric bladder wall thickening along the right lateral aspect of the urinary bladder wall. Correlation with outpatient cystoscopy is recommended. 4. Prostatomegaly. These results were called by telephone at the time of interpretation on 12/11/2018 at 1:39 am to provider [facility] , who verbally acknowledged these results. Electronically Signed   By: Constance Holster M.D.   On: 12/11/2018 01:42    Review of Systems  All other systems reviewed and are negative.  Blood pressure (!) 128/94, pulse 74, temperature 98.2 F (36.8 C), temperature source Oral, resp. rate 15, height 5\' 7"  (1.702 m), weight 86.2 kg, SpO2 98 %. Physical Exam  Constitutional: He is oriented to person, place, and time. Vital signs are normal. He appears  well-developed.  HENT:  Head: Normocephalic and atraumatic.  Eyes: Pupils are equal, round, and reactive to light. Conjunctivae, EOM and lids are normal. Lids are everted and swept, no foreign bodies found.  Neck: Normal range of motion. Neck supple.  Cardiovascular: Normal rate and regular rhythm.  Respiratory: Effort normal and breath sounds normal.  GI: Soft. Normal appearance and bowel sounds are normal.  Musculoskeletal: Normal range of motion.  Neurological: He is alert and oriented to person, place, and time. He has normal reflexes.  Skin: Skin is warm and dry.  Psychiatric: He has a normal mood and affect.    Assessment/Plan: Diverticular Bleeding/ recurrent/ colonic diverticulosis.  Admit to medicine to step down, monitor  HxH.  Plan for mesenteric angiography and possible micro bead embolization. Type and Cross for two units and hold.  Elmore Guise 12/11/2018, 2:38 AM

## 2018-12-11 NOTE — ED Notes (Addendum)
Pt rectal bleeding for two days, started on thrusday 1 episode and today 1x PTA . Pt denies SHOB, CP, dizziness. NAD noted upon assessment.

## 2018-12-11 NOTE — Progress Notes (Signed)
Patient ripped off telemetry monitor, pulse ox, and blood pressure cuff to go to the bathroom rather than letting the nurse help him to take off the equipment then started yelling at the nurse helping him as well as the primary nurse telling them to leave the room and leave him alone. Central tele called and was notified that patient is refusing to be hooked back up and NP was told that the nurse was unable to start fluid and administer Protonix at this time since the charge nurse said to leave patient alone for the time being. Will continue to monitor and pass on to day shift nurse.

## 2018-12-11 NOTE — ED Provider Notes (Signed)
[facility] Emergency Department Provider Note   First MD Initiated Contact with Patient 12/11/18 [phone]     (approximate)  I have reviewed the triage vital signs and the nursing notes.   HISTORY  Chief Complaint Rectal Bleeding   HPI Cody Herrera is a 73 y.o. 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 Delana Meyer presents to the emergency department tonight secondary to 2 episodes of painless bright red blood per rectum in the past 3 days.  Patient states 3 days ago he had an episode of bright red blood noted in the commode.  Patient state that bleeding reoccurred tonight and its described as "a lot".  Patient denies any abdominal pain.  Patient denies any nausea or vomiting.  Patient denies any dizziness.  Patient denies any chest pain or shortness of breath.        Past Medical History:  Diagnosis Date   Hypercholesteremia    Hyperlipidemia    Hypertension     Patient Active Problem List   Diagnosis Date Noted   Chest pain 10/08/2017    Past Surgical History:  Procedure Laterality Date   ABDOMINAL SURGERY      Prior to Admission medications   Medication Sig Start Date End Date Taking? Authorizing Provider  atenolol (TENORMIN) 100 MG tablet Take 100 mg by mouth daily.   Yes [provider]  BESIVANCE 0.6 % SUSP Place 1 drop into the left eye 3 (three) times daily. 11/12/18  Yes [provider]  DUREZOL 0.05 % EMUL Place 1 drop into the left eye 3 (three) times daily. 11/12/18  Yes [provider]  Flaxseed, Linseed, (FLAX SEEDS PO) Take 1 capsule by mouth daily.   Yes [provider]  lisinopril (ZESTRIL) 20 MG tablet Take 20 mg by mouth daily.   Yes [provider]  PROLENSA 0.07 % SOLN Place 1 drop into the left eye at bedtime. 11/12/18  Yes [provider]  simvastatin  (ZOCOR) 40 MG tablet Take 40 mg by mouth at bedtime.   Yes [provider]    Allergies Patient has no known allergies.  Family History  Problem Relation Age of Onset   Heart attack Mother    Heart attack Father     Social History Social History   Tobacco Use   Smoking status: Never Smoker   Smokeless tobacco: Never Used  Substance Use Topics   Alcohol use: Yes    Frequency: Never    Comment: ocassinally   Drug use: Never    Review of Systems Constitutional: No fever/chills Eyes: No visual changes. ENT: No sore throat. Cardiovascular: Denies chest pain. Respiratory: Denies shortness of breath. Gastrointestinal: No abdominal pain.  No nausea, no vomiting.  No diarrhea.  No constipation.  Positive for bright red blood per rectum. Genitourinary: Negative for dysuria. Musculoskeletal: Negative for neck pain.  Negative for back pain. Integumentary: Negative for rash. Neurological: Negative for headaches, focal weakness or numbness.  ____________________________________________   PHYSICAL EXAM:  VITAL SIGNS: ED Triage Vitals [12/11/18 0017]  Enc Vitals Group     BP (!) 139/106     Pulse Rate 71     Resp 18     Temp 98.2 F (36.8 C)     Temp Source Oral     SpO2 95 %     Weight 86.2 kg (190 lb)  Height 1.702 m (5\' 7" )     Head Circumference      Peak Flow      Pain Score 0     Pain Loc      Pain Edu?      Excl. in [place]?     Constitutional: Alert and oriented.  Eyes: Conjunctivae are normal.  Mouth/Throat: Patient is wearing a mask. Neck: No stridor.  No meningeal signs.   Cardiovascular: Normal rate, regular rhythm. Good peripheral circulation. Grossly normal heart sounds. Respiratory: Normal respiratory effort.  No retractions. Gastrointestinal: Soft and nontender. No distention.  Bright red blood noted in the commode x2 episodes. Musculoskeletal: No lower extremity tenderness nor edema. No gross deformities of extremities. Neurologic:   Normal speech and language. No gross focal neurologic deficits are appreciated.  Skin:  Skin is warm, dry and intact. Psychiatric: Mood and affect are normal. Speech and behavior are normal.  ____________________________________________   LABS (all labs ordered are listed, but only abnormal results are displayed)  Labs Reviewed  COMPREHENSIVE METABOLIC PANEL - Abnormal; Notable for the following components:      Result Value   Glucose, Bld 117 (*)    All other components within normal limits  SARS CORONAVIRUS 2 BY RT PCR (HOSPITAL ORDER, [facility])  CBC  HEMOGLOBIN AND HEMATOCRIT, BLOOD  POC OCCULT BLOOD, ED  TYPE AND SCREEN     RADIOLOGY I, [place], personally viewed and evaluated these images (plain radiographs) as part of my medical decision making, as well as reviewing the written report by the radiologist.  ED MD interpretation: Positive for active active arterial extravasation involving the proximal descending colon most likely secondary to diverticular disease per radiologist.  Official radiology report(s): Ct Angio Abd/pel W And/or Wo Contrast  Result Date: 12/11/2018 CLINICAL DATA:  Rectal bleeding EXAM: CTA ABDOMEN AND PELVIS WITHOUT AND WITH CONTRAST TECHNIQUE: Multidetector CT imaging of the abdomen and pelvis was performed using the standard protocol during bolus administration of intravenous contrast. Multiplanar reconstructed images and MIPs were obtained and reviewed to evaluate the vascular anatomy. CONTRAST:  1103mL OMNIPAQUE IOHEXOL 350 MG/ML SOLN COMPARISON:  CT dated October 08, 2017. FINDINGS: VASCULAR Aorta: There are atherosclerotic changes of the abdominal aorta without evidence for an aneurysm or high-grade stenosis. Celiac: Patent without evidence of aneurysm, dissection, vasculitis or significant stenosis. SMA: Patent without evidence of aneurysm, dissection, vasculitis or significant stenosis. Renals: Both renal arteries  are patent without evidence of aneurysm, dissection, vasculitis, fibromuscular dysplasia or significant stenosis. IMA: Patent without evidence of aneurysm, dissection, vasculitis or significant stenosis. Inflow: Patent without evidence of aneurysm, dissection, vasculitis or significant stenosis. Proximal Outflow: Bilateral common femoral and visualized portions of the superficial and profunda femoral arteries are patent without evidence of aneurysm, dissection, vasculitis or significant stenosis. Veins: No obvious venous abnormality within the limitations of this arterial phase study. Review of the MIP images confirms the above findings. NON-VASCULAR Lower chest: The lung bases are clear. The heart size is normal. Hepatobiliary: The liver is normal. Normal gallbladder.There is no biliary ductal dilation. Pancreas: Normal contours without ductal dilatation. No peripancreatic fluid collection. 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 This is stable from prior study. Adrenals/Urinary Tract: --Adrenal glands: No adrenal hemorrhage. --Right kidney/ureter: No hydronephrosis or perinephric hematoma. --Left kidney/ureter: No hydronephrosis or perinephric hematoma. --Urinary bladder: There is asymmetric bladder wall thickening along the right lateral aspect of the urinary bladder  wall. 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 --Small bowel: No dilatation or inflammation. --Colon: There is scattered colonic diverticula. There is evidence for active arterial extravasation at the level of the proximal descending colon. This is best visualized on the arterial coronal series 9, image 59 and venous coronal series 14, image 58. There is some mild wall thickening of the remaining portions of the colon which is presumably reactive. --Appendix: Normal. Lymphatic: --No retroperitoneal lymphadenopathy. --No mesenteric  lymphadenopathy. --No pelvic or inguinal lymphadenopathy. Reproductive: The prostate gland is significantly enlarged. Other: No ascites or free air. The abdominal wall is normal. Musculoskeletal. No acute displaced fractures. IMPRESSION: 1. The study is positive for active arterial extravasation involving the proximal descending colon. 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 2. Minimal atherosclerotic disease without evidence for an abdominal aortic aneurysm. 3. Asymmetric bladder wall thickening along the right lateral aspect of the urinary bladder wall. Correlation with outpatient cystoscopy is recommended. 4. Prostatomegaly. These results were called by telephone at the time of interpretation on 12/11/2018 at 1:39 am to provider [facility] , who verbally acknowledged these results. Electronically Signed   By: Constance Holster M.D.   On: 12/11/2018 01:42      Procedures   ____________________________________________   INITIAL IMPRESSION / MDM / [place] / ED COURSE  As part of my medical decision making, I reviewed the following data within the electronic MEDICAL RECORD NUMBER   73 year old male with above-stated history and physical exam secondary to lower GI bleed.  Concern for possible arterial hemorrhage given previous history and so CT angiogram was performed which revealed arterial extravasation which I was notified by Dr. Nyoka Cowden radiology.  Patient discussed with Dr.Antezana who presented to the emergency department promptly and evaluated patient with plan for embolization this morning.  Patient also evaluated by Dr. Marcille Blanco hospitalist for admission.  Patient remained hemodynamically stable while in the emergency department.  Patient's initial hemoglobin hematocrit was 15 and 45 respectively.  Repeat hemoglobin 14 and 42.  ____________________________________________  FINAL CLINICAL IMPRESSION(S) / ED  DIAGNOSES  Final diagnoses:  Lower GI bleed     MEDICATIONS GIVEN DURING THIS VISIT:  Medications  iohexol (OMNIPAQUE) 350 MG/ML injection 125 mL (125 mLs Intravenous Contrast Given 12/11/18 0107)  sodium chloride 0.9 % bolus 1,000 mL (1,000 mLs Intravenous New Bag/Given 12/11/18 0153)     ED Discharge Orders    None      *Please note:  Cody Herrera was evaluated in Emergency Department on 12/11/2018 for the symptoms described in the history of present illness. He was evaluated in the context of the global COVID-19 pandemic, which necessitated consideration that the patient might be at risk for infection with the SARS-CoV-2 virus that causes COVID-19. Institutional protocols and algorithms that pertain to the evaluation of patients at risk for COVID-19 are in a state of rapid change based on information released by regulatory bodies including the CDC and federal and state organizations. These policies and algorithms were followed during the patient's care in the ED.  Some ED evaluations and interventions may be delayed as a result of limited staffing during the pandemic.*  Note:  This document was prepared using Dragon voice recognition software and may include unintentional dictation errors.   Gregor Hams, MD 12/11/18 [phone]

## 2018-12-11 NOTE — ED Notes (Signed)
ED TO INPATIENT HANDOFF REPORT  ED Nurse Name and Phone #: [phone]  S Name/Age/Gender Cody Herrera 73 y.o. male Room/Bed: ED03A/ED03A  Code Status   Code Status: Prior  Home/SNF/Other Home A/Ox4 Is this baseline? Yes   Triage Complete: Triage complete  Chief Complaint Rectal Bleeding  Triage Note Patient c/o rectal bleeding. Patient reports 1 episode Thursday and bleeding again today. Patient reports heavy bleeding that is straight liquid. Patient reports hx of rectal bleeding in the past. Patient was dx with he believes and abdominal aneurysm; patient has surgery to repair and multiple blood transfusions during his hospitalization.    Allergies No Known Allergies  Level of Care/Admitting Diagnosis ED Disposition    ED Disposition Condition Comment   Admit  The patient appears reasonably stabilized for admission considering the current resources, flow, and capabilities available in the ED at this time, and I doubt any other [facility] requiring further screening and/or treatment in the ED prior to admission is  present.       B Medical/Surgery History Past Medical History:  Diagnosis Date  . Hypercholesteremia   . Hyperlipidemia   . Hypertension    Past Surgical History:  Procedure Laterality Date  . ABDOMINAL SURGERY       A IV Location/Drains/Wounds Patient Lines/Drains/Airways Status   Active Line/Drains/Airways    Name:   Placement date:   Placement time:   Site:   Days:   Peripheral IV 12/11/18 Right Antecubital   12/11/18    0048    Antecubital   less than 1   Peripheral IV 12/11/18 Left Antecubital   12/11/18    0052    Antecubital   less than 1          Intake/Output Last 24 hours No intake or output data in the 24 hours ending 12/11/18 0235  Labs/Imaging Results for orders placed or performed during the hospital encounter of 12/11/18 (from the past 48 hour(s))  Comprehensive metabolic panel     Status: Abnormal   Collection Time: 12/11/18  12:25 AM  Result Value Ref Range   Sodium 142 135 - 145 mmol/L   Potassium 3.6 3.5 - 5.1 mmol/L   Chloride 101 98 - 111 mmol/L   CO2 29 22 - 32 mmol/L   Glucose, Bld 117 (H) 70 - 99 mg/dL   BUN 19 8 - 23 mg/dL   Creatinine, Ser 0.82 0.61 - 1.24 mg/dL   Calcium 9.6 8.9 - 10.3 mg/dL   Total Protein 7.7 6.5 - 8.1 g/dL   Albumin 4.4 3.5 - 5.0 g/dL   AST 25 15 - 41 U/L   ALT 34 0 - 44 U/L   Alkaline Phosphatase 71 38 - 126 U/L   Total Bilirubin 0.7 0.3 - 1.2 mg/dL   GFR calc non Af Amer >60 >60 mL/min   GFR calc Af Amer >60 >60 mL/min   Anion gap 12 5 - 15    Comment: Performed at [facility], [address] [place], [place] [address]  CBC     Status: None   Collection Time: 12/11/18 12:25 AM  Result Value Ref Range   WBC 8.7 4.0 - 10.5 K/uL   RBC 4.83 4.22 - 5.81 MIL/uL   Hemoglobin 15.0 13.0 - 17.0 g/dL   HCT 45.2 39.0 - 52.0 %   MCV 93.6 80.0 - 100.0 fL   MCH 31.1 26.0 - 34.0 pg   MCHC 33.2 30.0 - 36.0 g/dL   RDW 11.8 11.5 -  15.5 %   Platelets 216 150 - 400 K/uL   nRBC 0.0 0.0 - 0.2 %    Comment: Performed at [facility], [address] [place], [place] [address]  Type and screen [place]     Status: None   Collection Time: 12/11/18 12:25 AM  Result Value Ref Range   ABO/RH(D) O POS    Antibody Screen NEG    Sample Expiration      12/14/2018,2359 Performed at [facility], [address] [place], [place] [address]   Hemoglobin and hematocrit, blood     Status: None   Collection Time: 12/11/18  1:53 AM  Result Value Ref Range   Hemoglobin 14.0 13.0 - 17.0 g/dL   HCT 42.4 39.0 - 52.0 %    Comment: Performed at [facility], [place], [place] [address]   Ct Angio Abd/pel W And/or Wo Contrast  Result Date: 12/11/2018 CLINICAL DATA:  Rectal bleeding EXAM: CTA ABDOMEN AND PELVIS WITHOUT AND WITH CONTRAST TECHNIQUE: Multidetector CT imaging of the abdomen and pelvis was performed using the  standard protocol during bolus administration of intravenous contrast. Multiplanar reconstructed images and MIPs were obtained and reviewed to evaluate the vascular anatomy. CONTRAST:  119mL OMNIPAQUE IOHEXOL 350 MG/ML SOLN COMPARISON:  CT dated October 08, 2017. FINDINGS: VASCULAR Aorta: There are atherosclerotic changes of the abdominal aorta without evidence for an aneurysm or high-grade stenosis. Celiac: Patent without evidence of aneurysm, dissection, vasculitis or significant stenosis. SMA: Patent without evidence of aneurysm, dissection, vasculitis or significant stenosis. Renals: Both renal arteries are patent without evidence of aneurysm, dissection, vasculitis, fibromuscular dysplasia or significant stenosis. IMA: Patent without evidence of aneurysm, dissection, vasculitis or significant stenosis. Inflow: Patent without evidence of aneurysm, dissection, vasculitis or significant stenosis. Proximal Outflow: Bilateral common femoral and visualized portions of the superficial and profunda femoral arteries are patent without evidence of aneurysm, dissection, vasculitis or significant stenosis. Veins: No obvious venous abnormality within the limitations of this arterial phase study. Review of the MIP images confirms the above findings. NON-VASCULAR Lower chest: The lung bases are clear. The heart size is normal. Hepatobiliary: The liver is normal. Normal gallbladder.There is no biliary ductal dilation. Pancreas: Normal contours without ductal dilatation. No peripancreatic fluid collection. 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 This is stable from prior study. Adrenals/Urinary Tract: --Adrenal glands: No adrenal hemorrhage. --Right kidney/ureter: No hydronephrosis or perinephric hematoma. --Left kidney/ureter: No hydronephrosis or perinephric hematoma. --Urinary bladder: There is asymmetric bladder wall thickening along the right lateral aspect  of the urinary bladder wall. 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 --Small bowel: No dilatation or inflammation. --Colon: There is scattered colonic diverticula. There is evidence for active arterial extravasation at the level of the proximal descending colon. This is best visualized on the arterial coronal series 9, image 59 and venous coronal series 14, image 58. There is some mild wall thickening of the remaining portions of the colon which is presumably reactive. --Appendix: Normal. Lymphatic: --No retroperitoneal lymphadenopathy. --No mesenteric lymphadenopathy. --No pelvic or inguinal lymphadenopathy. Reproductive: The prostate gland is significantly enlarged. Other: No ascites or free air. The abdominal wall is normal. Musculoskeletal. No acute displaced fractures. IMPRESSION: 1. The study is positive for active arterial extravasation involving the proximal descending colon. 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 2. Minimal atherosclerotic disease without evidence for  an abdominal aortic aneurysm. 3. Asymmetric bladder wall thickening along the right lateral aspect of the urinary bladder wall. Correlation with outpatient cystoscopy is recommended. 4. Prostatomegaly. These results were called by telephone at the time of interpretation on 12/11/2018 at 1:39 am to provider [facility] , who verbally acknowledged these results. Electronically Signed   By: Constance Holster M.D.   On: 12/11/2018 01:42    Pending Labs Unresulted Labs (From admission, onward)    Start     Ordered   12/11/18 0207  SARS Coronavirus 2 by RT PCR (hospital order, performed in [facility] hospital lab) Nasopharyngeal Nasopharyngeal Swab  (Symptomatic/High Risk of Exposure/Tier 1 Patients Labs with Precautions)  Once,   STAT    Question Answer Comment  Is this test for diagnosis or screening Screening   Symptomatic  for COVID-19 as defined by CDC No   Hospitalized for COVID-19 No   Admitted to ICU for COVID-19 No   Previously tested for COVID-19 No   Resident in a congregate (group) care setting No   Employed in healthcare setting No      12/11/18 0206          Vitals/Pain Today's Vitals   12/11/18 0017 12/11/18 0121 12/11/18 0123 12/11/18 0130  BP: (!) 139/106 (!) 160/83  (!) 128/94  Pulse: 71  73 74  Resp: 18  13 15   Temp: 98.2 F (36.8 C)     TempSrc: Oral     SpO2: 95%  99% 98%  Weight: 86.2 kg     Height: 5\' 7"  (1.702 m)     PainSc: 0-No pain       Isolation Precautions No active isolations  Medications Medications  iohexol (OMNIPAQUE) 350 MG/ML injection 125 mL (125 mLs Intravenous Contrast Given 12/11/18 0107)  sodium chloride 0.9 % bolus 1,000 mL (1,000 mLs Intravenous New Bag/Given 12/11/18 0153)    Mobility walks Low fall risk      R Recommendations: See Admitting Provider Note  Report given to:   Additional Notes: pt ambulatory with a steady gait.

## 2018-12-12 LAB — HEMOGLOBIN AND HEMATOCRIT, BLOOD
HCT: 37.5 % — ABNORMAL LOW (ref 39.0–52.0)
HCT: 35.5 % — ABNORMAL LOW (ref 39.0–52.0)
Hemoglobin: 12.5 g/dL — ABNORMAL LOW (ref 13.0–17.0)
Hemoglobin: 11.7 g/dL — ABNORMAL LOW (ref 13.0–17.0)

## 2018-12-12 LAB — BPAM RBC
Blood Product Expiration Date: 202010312359
Unit Type and Rh: 9500

## 2018-12-12 LAB — TYPE AND SCREEN
ABO/RH(D): O POS
Antibody Screen: NEGATIVE
Unit division: 0

## 2018-12-12 LAB — PREPARE RBC (CROSSMATCH)

## 2018-12-12 MED ORDER — LISINOPRIL 5 MG PO TABS
10.0000 mg | ORAL_TABLET | Freq: Every day | ORAL | Status: DC
  Administered 2018-12-12: 10 mg via ORAL
  Filled 2018-12-12: qty 2

## 2018-12-12 MED ORDER — ATENOLOL 25 MG PO TABS
100.0000 mg | ORAL_TABLET | Freq: Every day | ORAL | Status: DC
  Administered 2018-12-12: 100 mg via ORAL
  Filled 2018-12-12: qty 4

## 2018-12-12 MED ORDER — SIMVASTATIN 20 MG PO TABS: 40.0000 mg | ORAL_TABLET | Freq: Every day | ORAL | Status: DC

## 2018-12-12 MED ORDER — SIMVASTATIN 20 MG PO TABS
40.0000 mg | ORAL_TABLET | Freq: Every morning | ORAL | Status: DC
  Administered 2018-12-12: 40 mg via ORAL
  Filled 2018-12-12: qty 2

## 2018-12-12 NOTE — Plan of Care (Signed)

## 2018-12-12 NOTE — Progress Notes (Signed)
Patient tachycardic while sitting on the bedside commode and was tachycardic up into the 140s.  This RN asked if the patient felt okay because of the increase in his heart rate.  The patient yelled at this RN stating he felt fine and to get out of his room and if there was something wrong he would know it.  Patient's wishes respected and patient left alone.

## 2018-12-12 NOTE — Progress Notes (Signed)
1 Day Post-Op   Subjective/Chief Complaint: Patient denies any more bloody BM.  He is hungry and wants to go home.     Objective: Vital signs in last 24 hours: Temp:  [98.2 F (36.8 C)-98.6 F (37 C)] 98.2 F (36.8 C) (11/01 0728) Pulse Rate:  [46-110] 93 (11/01 1050) Resp:  [13-30] 18 (11/01 0728) BP: (124-166)/(70-95) 155/95 (11/01 1050) SpO2:  [83 %-100 %] 99 % (11/01 0728) Last BM Date: 12/11/18  Intake/Output from previous day: 10/31 0701 - 11/01 0700 In: 1349.5 [I.V.:1349.5] Out: -  Intake/Output this shift: Total I/O In: -  Out: 500 [Urine:500]  General appearance: alert, cooperative and appears stated age Head: Normocephalic, without obvious abnormality, atraumatic Eyes: conjunctivae/corneas clear. PERRL, EOM's intact. Fundi benign. Neck: no adenopathy, no carotid bruit, no JVD, supple, symmetrical, trachea midline and thyroid not enlarged, symmetric, no tenderness/mass/nodules Resp: clear to auscultation bilaterally Chest wall: no tenderness GI: soft, non-tender; bowel sounds normal; no masses,  no organomegaly Incision/Wound:  Lab Results:  Recent Labs    12/11/18 0025  12/11/18 0800  12/12/18 0102 12/12/18 0656  WBC 8.7  --  7.4  --   --   --   HGB 15.0   < > 13.1   < > 12.5* 11.7*  HCT 45.2   < > 39.1   < > 37.5* 35.5*  PLT 216  --  190  --   --   --    < > = values in this interval not displayed.   BMET Recent Labs    12/11/18 0025 12/11/18 0800  NA 142 140  K 3.6 3.7  CL 101 105  CO2 29 26  GLUCOSE 117* 102*  BUN 19 15  CREATININE 0.82 0.79  CALCIUM 9.6 8.7*   PT/INR Recent Labs    12/11/18 0800  LABPROT 13.8  INR 1.1   ABG No results for input(s): PHART, HCO3 in the last 72 hours.  Invalid input(s): PCO2, PO2  Studies/Results: Ct Angio Abd/pel W And/or Wo Contrast  Result Date: 12/11/2018 CLINICAL DATA:  Rectal bleeding EXAM: CTA ABDOMEN AND PELVIS WITHOUT AND WITH CONTRAST TECHNIQUE: Multidetector CT imaging of the abdomen  and pelvis was performed using the standard protocol during bolus administration of intravenous contrast. Multiplanar reconstructed images and MIPs were obtained and reviewed to evaluate the vascular anatomy. CONTRAST:  157mL OMNIPAQUE IOHEXOL 350 MG/ML SOLN COMPARISON:  CT dated October 08, 2017. FINDINGS: VASCULAR Aorta: There are atherosclerotic changes of the abdominal aorta without evidence for an aneurysm or high-grade stenosis. Celiac: Patent without evidence of aneurysm, dissection, vasculitis or significant stenosis. SMA: Patent without evidence of aneurysm, dissection, vasculitis or significant stenosis. Renals: Both renal arteries are patent without evidence of aneurysm, dissection, vasculitis, fibromuscular dysplasia or significant stenosis. IMA: Patent without evidence of aneurysm, dissection, vasculitis or significant stenosis. Inflow: Patent without evidence of aneurysm, dissection, vasculitis or significant stenosis. Proximal Outflow: Bilateral common femoral and visualized portions of the superficial and profunda femoral arteries are patent without evidence of aneurysm, dissection, vasculitis or significant stenosis. Veins: No obvious venous abnormality within the limitations of this arterial phase study. Review of the MIP images confirms the above findings. NON-VASCULAR Lower chest: The lung bases are clear. The heart size is normal. Hepatobiliary: The liver is normal. Normal gallbladder.There is no biliary ductal dilation. Pancreas: Normal contours without ductal dilatation. No peripancreatic fluid collection. 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 This is stable from prior  study. Adrenals/Urinary Tract: --Adrenal glands: No adrenal hemorrhage. --Right kidney/ureter: No hydronephrosis or perinephric hematoma. --Left kidney/ureter: No hydronephrosis or perinephric hematoma. --Urinary bladder: There is asymmetric bladder wall  thickening along the right lateral aspect of the urinary bladder wall. 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 --Small bowel: No dilatation or inflammation. --Colon: There is scattered colonic diverticula. There is evidence for active arterial extravasation at the level of the proximal descending colon. This is best visualized on the arterial coronal series 9, image 59 and venous coronal series 14, image 58. There is some mild wall thickening of the remaining portions of the colon which is presumably reactive. --Appendix: Normal. Lymphatic: --No retroperitoneal lymphadenopathy. --No mesenteric lymphadenopathy. --No pelvic or inguinal lymphadenopathy. Reproductive: The prostate gland is significantly enlarged. Other: No ascites or free air. The abdominal wall is normal. Musculoskeletal. No acute displaced fractures. IMPRESSION: 1. The study is positive for active arterial extravasation involving the proximal descending colon. 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 2. Minimal atherosclerotic disease without evidence for an abdominal aortic aneurysm. 3. Asymmetric bladder wall thickening along the right lateral aspect of the urinary bladder wall. Correlation with outpatient cystoscopy is recommended. 4. Prostatomegaly. These results were called by telephone at the time of interpretation on 12/11/2018 at 1:39 am to provider [facility] , who verbally acknowledged these results. Electronically Signed   By: Constance Holster M.D.   On: 12/11/2018 01:42    Anti-infectives: Anti-infectives (From admission, onward)   Start     Dose/Rate Route Frequency Ordered Stop   12/11/18 1100  ceFAZolin (ANCEF) IVPB 1 g/50 mL premix     1 g 100 mL/hr over 30 Minutes Intravenous  Once 12/11/18 1048 12/11/18 1119      Assessment/Plan: s/p Procedure(s): VISCERAL ANGIOGRAPHY (Bilateral) Discharge  Recommend  minimally invasive laparoscopic surgery for left colectomy for recurrent diverticular bleeding. Follow-up in 1-2 weeks with Vascular Surgery  LOS: 1 day    Elmore Guise 12/12/2018

## 2018-12-12 NOTE — Progress Notes (Signed)
Patient discharged from the hospital this AM. Appointments: Patient instructed to follow-up with either Dr. Feliberto Gottron in [place], or with [facility], his choice, this week. Also instructed to follow-up with PCP this week. No medication changes. PIVs removed prior to discharge. Patient denies questions or concerns at this time. Patient leaves with a family member from the drop-off location at the [facility].

## 2018-12-12 NOTE — Discharge Summary (Signed)
Physician Discharge Summary  Anchor Huisinga Fehrman [id number] DOB: 1946-02-10 DOA: 12/11/2018  PCP: Valera Castle, MD  Admit date: 12/11/2018 Discharge date: 12/12/2018  Admitted From: Home Discharge disposition: Home   Recommendations for Outpatient Follow-Up:   1. Follow-up with Dr. Feliberto Gottron in 1 week.  2.   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  Diet recommendation: Low sodium, heart healthy.  Carbohydrate-modified.  Regular.  Wound care: None.  Code status: Full.   History of Present Illness:   Mr. Branscome is a 73 year old man with history of diverticular bleed, hypertension, hyperlipidemia, CAD who presented to the hospital because of rectal bleeding.   Hospital Course by Problem:   Mr. Ha is a 73 year old man with history of diverticular bleed requiring treatment with embolization, hypertension, hyperlipidemia, CAD who presented to the hospital because of rectal bleeding.  He was seen in consultation by the vascular surgeon, Dr. Feliberto Gottron.  He underwent selective injection of the superior mesenteric artery and embolization of the mesenteric branches on 12/11/2018.  Rectal bleeding has resolved patient has remained hemodynamically stable.  Case was discussed with Dr. Feliberto Gottron from his standpoint, patient is okay for discharge.    Medical Consultants:    Dr. Feliberto Gottron   Discharge Exam:   Vitals:   12/12/18 1049 12/12/18 1050  BP: (!) 155/95 (!) 155/95  Pulse:  93  Resp:    Temp:    SpO2:     Vitals:   12/12/18 0800 12/12/18 1000 12/12/18 1049 12/12/18 1050  BP: (!) 147/95  (!) 155/95 (!) 155/95  Pulse: 88 86  93  Resp: 18 19    Temp: 98.2 F (36.8 C)     TempSrc:      SpO2:      Weight:      Height:        General exam: Appears calm and comfortable.  Respiratory  system: Clear to auscultation. Respiratory effort normal. Cardiovascular system: S1 & S2 heard, RRR. No JVD,  rubs, gallops. No murmurs heard. Gastrointestinal system: Abdomen is nondistended, soft and nontender. No organomegaly or masses felt. Normal bowel sounds heard. Central nervous system: Alert and oriented. No focal neurological deficits. Extremities: No clubbing,  or cyanosis. No edema. Skin: Small right groin surgical wound (puncture site) looks good without any bleeding.  No rashes, lesions or ulcers. Psychiatry: Judgement and insight appear normal. Mood & affect appropriate.    The results of significant diagnostics from this hospitalization (including imaging, microbiology, ancillary and laboratory) are listed below for reference.     Procedures and Diagnostic Studies:   Ct Angio Abd/pel W And/or Wo Contrast  Result Date: 12/11/2018 CLINICAL DATA:  Rectal bleeding EXAM: CTA ABDOMEN AND PELVIS WITHOUT AND WITH CONTRAST TECHNIQUE: Multidetector CT imaging of the abdomen and pelvis was performed using the standard protocol during bolus administration of intravenous contrast. Multiplanar reconstructed images and MIPs were obtained and reviewed to evaluate the vascular anatomy. CONTRAST:  148mL OMNIPAQUE IOHEXOL 350 MG/ML SOLN COMPARISON:  CT dated October 08, 2017. FINDINGS: VASCULAR Aorta: There are atherosclerotic changes of the abdominal aorta without evidence for an aneurysm or high-grade stenosis. Celiac: Patent without evidence of aneurysm, dissection, vasculitis or significant stenosis. SMA: Patent without evidence of aneurysm, dissection, vasculitis or significant stenosis. Renals: Both renal arteries are patent without evidence of aneurysm, dissection, vasculitis, fibromuscular dysplasia or significant stenosis. IMA: Patent  without evidence of aneurysm, dissection, vasculitis or significant stenosis. Inflow: Patent without evidence of aneurysm, dissection, vasculitis or significant  stenosis. Proximal Outflow: Bilateral common femoral and visualized portions of the superficial and profunda femoral arteries are patent without evidence of aneurysm, dissection, vasculitis or significant stenosis. Veins: No obvious venous abnormality within the limitations of this arterial phase study. Review of the MIP images confirms the above findings. NON-VASCULAR Lower chest: The lung bases are clear. The heart size is normal. Hepatobiliary: The liver is normal. Normal gallbladder.There is no biliary ductal dilation. Pancreas: Normal contours without ductal dilatation. No peripancreatic fluid collection. 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 This is stable from prior study. Adrenals/Urinary Tract: --Adrenal glands: No adrenal hemorrhage. --Right kidney/ureter: No hydronephrosis or perinephric hematoma. --Left kidney/ureter: No hydronephrosis or perinephric hematoma. --Urinary bladder: There is asymmetric bladder wall thickening along the right lateral aspect of the urinary bladder wall. 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 --Small bowel: No dilatation or inflammation. --Colon: There is scattered colonic diverticula. There is evidence for active arterial extravasation at the level of the proximal descending colon. This is best visualized on the arterial coronal series 9, image 59 and venous coronal series 14, image 58. There is some mild wall thickening of the remaining portions of the colon which is presumably reactive. --Appendix: Normal. Lymphatic: --No retroperitoneal lymphadenopathy. --No mesenteric lymphadenopathy. --No pelvic or inguinal lymphadenopathy. Reproductive: The prostate gland is significantly enlarged. Other: No ascites or free air. The abdominal wall is normal. Musculoskeletal. No acute displaced fractures. IMPRESSION: 1. The study is positive for active arterial extravasation  involving the proximal descending colon. 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 2. Minimal atherosclerotic disease without evidence for an abdominal aortic aneurysm. 3. Asymmetric bladder wall thickening along the right lateral aspect of the urinary bladder wall. Correlation with outpatient cystoscopy is recommended. 4. Prostatomegaly. These results were called by telephone at the time of interpretation on 12/11/2018 at 1:39 am to provider [facility] , who verbally acknowledged these results. Electronically Signed   By: Constance Holster M.D.   On: 12/11/2018 01:42     Labs:   Basic Metabolic Panel: Recent Labs  Lab 12/11/18 0025 12/11/18 0800  NA 142 140  K 3.6 3.7  CL 101 105  CO2 29 26  GLUCOSE 117* 102*  BUN 19 15  CREATININE 0.82 0.79  CALCIUM 9.6 8.7*   GFR Estimated Creatinine Clearance: 88.8 mL/min (by C-G formula based on SCr of 0.79 mg/dL). Liver Function Tests: Recent Labs  Lab 12/11/18 0025  AST 25  ALT 34  ALKPHOS 71  BILITOT 0.7  PROT 7.7  ALBUMIN 4.4   No results for input(s): LIPASE, AMYLASE in the last 168 hours. No results for input(s): AMMONIA in the last 168 hours. Coagulation profile Recent Labs  Lab 12/11/18 0800  INR 1.1    CBC: Recent Labs  Lab 12/11/18 0025  12/11/18 0800 12/11/18 1246 12/11/18 1906 12/12/18 0102 12/12/18 0656  WBC 8.7  --  7.4  --   --   --   --   NEUTROABS  --   --  4.9  --   --   --   --   HGB 15.0   < > 13.1 12.3* 12.6* 12.5* 11.7*  HCT 45.2   < > 39.1 37.5* 38.5* 37.5* 35.5*  MCV 93.6  --  92.2  --   --   --   --  PLT 216  --  190  --   --   --   --    < > = values in this interval not displayed.   Cardiac Enzymes: No results for input(s): CKTOTAL, CKMB, CKMBINDEX, TROPONINI in the last 168 hours. BNP: Invalid input(s): POCBNP CBG: Recent Labs  Lab 12/11/18 0343  GLUCAP 120*   D-Dimer No results for input(s): DDIMER in the last 72  hours. Hgb A1c Recent Labs    12/11/18 0025  HGBA1C 5.7*   Lipid Profile No results for input(s): CHOL, HDL, LDLCALC, TRIG, CHOLHDL, LDLDIRECT in the last 72 hours. Thyroid function studies Recent Labs    12/11/18 0025  TSH 8.169*   Anemia work up No results for input(s): VITAMINB12, FOLATE, FERRITIN, TIBC, IRON, RETICCTPCT in the last 72 hours. Microbiology Recent Results (from the past 240 hour(s))  SARS Coronavirus 2 by RT PCR (hospital order, performed in [facility] hospital lab) Nasopharyngeal Nasopharyngeal Swab     Status: None   Collection Time: 12/11/18  2:09 AM   Specimen: Nasopharyngeal Swab  Result Value Ref Range Status   SARS Coronavirus 2 NEGATIVE NEGATIVE Final    Comment: (NOTE) If result is NEGATIVE SARS-CoV-2 target nucleic acids are NOT DETECTED. The SARS-CoV-2 RNA is generally detectable in upper and lower  respiratory specimens during the acute phase of infection. The lowest  concentration of SARS-CoV-2 viral copies this assay can detect is 250  copies / mL. A negative result does not preclude SARS-CoV-2 infection  and should not be used as the sole basis for treatment or other  patient management decisions.  A negative result may occur with  improper specimen collection / handling, submission of specimen other  than nasopharyngeal swab, presence of viral mutation(s) within the  areas targeted by this assay, and inadequate number of viral copies  (<250 copies / mL). A negative result must be combined with clinical  observations, patient history, and epidemiological information. If result is POSITIVE SARS-CoV-2 target nucleic acids are DETECTED. The SARS-CoV-2 RNA is generally detectable in upper and lower  respiratory specimens dur ing the acute phase of infection.  Positive  results are indicative of active infection with SARS-CoV-2.  Clinical  correlation with patient history and other diagnostic information is  necessary to determine patient  infection status.  Positive results do  not rule out bacterial infection or co-infection with other viruses. If result is PRESUMPTIVE POSTIVE SARS-CoV-2 nucleic acids MAY BE PRESENT.   A presumptive positive result was obtained on the submitted specimen  and confirmed on repeat testing.  While 2019 novel coronavirus  (SARS-CoV-2) nucleic acids may be present in the submitted sample  additional confirmatory testing may be necessary for epidemiological  and / or clinical management purposes  to differentiate between  SARS-CoV-2 and other Sarbecovirus currently known to infect humans.  If clinically indicated additional testing with an alternate test  methodology [phone]) is advised. The SARS-CoV-2 RNA is generally  detectable in upper and lower respiratory sp ecimens during the acute  phase of infection. The expected result is Negative. Fact Sheet for Patients:  StrictlyIdeas.no Fact Sheet for Healthcare Providers: BankingDealers.co.za This test is not yet approved or cleared by the [place] FDA and has been authorized for detection and/or diagnosis of SARS-CoV-2 by FDA under an Emergency Use Authorization (EUA).  This EUA will remain in effect (meaning this test can be used) for the duration of the COVID-19 declaration under Section 564(b)(1) of the Act, 21 U.S.C. section 360bbb-3(b)(1),  unless the authorization is terminated or revoked sooner. Performed at [facility], [address] [place], [place] [address]   MRSA PCR Screening     Status: None   Collection Time: 12/11/18  3:40 AM   Specimen: Nasopharyngeal  Result Value Ref Range Status   MRSA by PCR NEGATIVE NEGATIVE Final    Comment:        The GeneXpert MRSA Assay (FDA approved for NASAL specimens only), is one component of a comprehensive MRSA colonization surveillance program. It is not intended to diagnose MRSA infection nor to guide or monitor  treatment for MRSA infections. Performed at [facility], [address] [place], [place] [address]      Discharge Instructions:   Discharge Instructions    Diet - low sodium heart healthy   Complete by: As directed    Increase activity slowly   Complete by: As directed      Allergies as of 12/12/2018   No Known Allergies     Medication List    TAKE these medications   atenolol 100 MG tablet Commonly known as: TENORMIN Take 100 mg by mouth daily.   Besivance 0.6 % Susp Generic drug: Besifloxacin HCl Place 1 drop into the left eye 3 (three) times daily.   Durezol 0.05 % Emul Generic drug: Difluprednate Place 1 drop into the left eye 3 (three) times daily.   FLAX SEEDS PO Take 1 capsule by mouth daily.   lisinopril 20 MG tablet Commonly known as: ZESTRIL Take 20 mg by mouth daily.   multivitamin with minerals Tabs tablet Take 1 tablet by mouth daily.   Prolensa 0.07 % Soln Generic drug: Bromfenac Sodium Place 1 drop into the left eye at bedtime.   simvastatin 40 MG tablet Commonly known as: ZOCOR Take 40 mg by mouth at bedtime.         Time coordinating discharge: 28 minutes  Signed:  Jennye Boroughs  Pager [phone] [facility] 12/12/2018, 4:55 PM

## 2018-12-14 ENCOUNTER — Encounter: Payer: Self-pay | Admitting: Surgery

## 2019-05-26 ENCOUNTER — Encounter: Payer: Self-pay | Admitting: Dermatology

## 2019-05-26 ENCOUNTER — Other Ambulatory Visit: Payer: Self-pay

## 2019-05-26 ENCOUNTER — Ambulatory Visit: Payer: Medicare HMO | Admitting: Dermatology

## 2019-05-26 DIAGNOSIS — L570 Actinic keratosis: Secondary | ICD-10-CM

## 2019-05-26 DIAGNOSIS — L821 Other seborrheic keratosis: Secondary | ICD-10-CM

## 2019-05-26 DIAGNOSIS — D1801 Hemangioma of skin and subcutaneous tissue: Secondary | ICD-10-CM

## 2019-05-26 DIAGNOSIS — D225 Melanocytic nevi of trunk: Secondary | ICD-10-CM | POA: Diagnosis not present

## 2019-05-26 DIAGNOSIS — L578 Other skin changes due to chronic exposure to nonionizing radiation: Secondary | ICD-10-CM

## 2019-05-26 DIAGNOSIS — Z1283 Encounter for screening for malignant neoplasm of skin: Secondary | ICD-10-CM | POA: Diagnosis not present

## 2019-05-26 DIAGNOSIS — L814 Other melanin hyperpigmentation: Secondary | ICD-10-CM

## 2019-05-26 DIAGNOSIS — L820 Inflamed seborrheic keratosis: Secondary | ICD-10-CM | POA: Diagnosis not present

## 2019-05-26 DIAGNOSIS — D229 Melanocytic nevi, unspecified: Secondary | ICD-10-CM

## 2019-05-26 NOTE — Progress Notes (Signed)
Follow-Up Visit   Subjective  Cody Herrera is a 74 y.o. male who presents for the following: Follow-up (UBSE. Hx AKs.). Patient has several spots to check on face and back, some irritating. Patient presents for upper body skin exam for skin cancer screening and mole check. The following portions of the chart were reviewed this encounter and updated as appropriate: Tobacco  Allergies  Meds  Problems  Med Hx  Surg Hx  Fam Hx      Review of Systems: No other skin or systemic complaints.  Objective  Well appearing patient in no apparent distress; mood and affect are within normal limits.  All skin waist up examined.  Objective  Posterior Scalp x 1, Forehead x 1, R sideburn x 1 (3): Erythematous thin papules/macules with gritty scale.   Objective  Back x 21, Left Arm x 2, Right Arm x 4, Right Temple x 1, Forehead x 3 (31): Erythematous keratotic or waxy stuck-on papule or plaque.   Objective  Left Mid Back: Irregular brown macule.  Assessment & Plan   Skin cancer screening performed today.  Actinic Damage - diffuse scaly erythematous macules with underlying dyspigmentation - Recommend daily broad spectrum sunscreen SPF 30+ to sun-exposed areas, reapply every 2 hours as needed.  - Call for new or changing lesions.  Seborrheic Keratoses - Stuck-on, waxy, tan-brown papules and plaques  - Discussed benign etiology and prognosis. - Observe - Call for any changes Lentigines - Scattered tan macules - Discussed due to sun exposure - Benign, observe - Call for any changes Hemangiomas - Red papules - Discussed benign nature - Observe - Call for any changes  Melanocytic Nevi - Tan-brown and/or pink-flesh-colored symmetric macules and papules - Benign appearing on exam today - Observation - Call clinic for new or changing moles - Recommend daily use of broad spectrum spf 30+ sunscreen to sun-exposed areas.   AK (actinic keratosis) (3) Posterior Scalp x  1, Forehead x 1, R sideburn x 1  Destruction of lesion - Posterior Scalp x 1, Forehead x 1, R sideburn x 1 Complexity: simple   Destruction method: cryotherapy   Informed consent: discussed and consent obtained   Timeout:  patient name, date of birth, surgical site, and procedure verified Lesion destroyed using liquid nitrogen: Yes   Region frozen until ice ball extended beyond lesion: Yes   Outcome: patient tolerated procedure well with no complications   Post-procedure details: wound care instructions given    Inflamed seborrheic keratosis (31) Back x 21, Left Arm x 2, Right Arm x 4, Right Temple x 1, Forehead x 3  Destruction of lesion - Back x 21, Left Arm x 2, Right Arm x 4, Right Temple x 1, Forehead x 3 Complexity: simple   Destruction method: cryotherapy   Informed consent: discussed and consent obtained   Timeout:  patient name, date of birth, surgical site, and procedure verified Lesion destroyed using liquid nitrogen: Yes   Region frozen until ice ball extended beyond lesion: Yes   Outcome: patient tolerated procedure well with no complications   Post-procedure details: wound care instructions given    Nevus Left Mid Back  Recommend biopsy. Patient defers today. Will plan shave removal on follow-up.  Return in about 6 weeks (around 07/07/2019) for AKs/ISKs, mole bx on back.   Lindi Adie, CMA, am acting as scribe for Sarina Ser, MD .Documentation: I have reviewed the above documentation for accuracy and completeness, and I agree with the above.  Shanon Brow  [name], MD

## 2019-07-06 ENCOUNTER — Ambulatory Visit: Payer: Medicare HMO | Admitting: Dermatology

## 2019-10-19 ENCOUNTER — Ambulatory Visit: Payer: Medicare HMO | Admitting: Dermatology

## 2019-11-17 ENCOUNTER — Ambulatory Visit: Payer: Medicare HMO | Admitting: Dermatology

## 2020-02-20 ENCOUNTER — Other Ambulatory Visit: Payer: Self-pay | Admitting: Orthopedic Surgery

## 2020-02-20 DIAGNOSIS — M4807 Spinal stenosis, lumbosacral region: Secondary | ICD-10-CM

## 2020-02-29 ENCOUNTER — Other Ambulatory Visit: Payer: Self-pay

## 2020-02-29 ENCOUNTER — Ambulatory Visit
Admission: RE | Admit: 2020-02-29 | Discharge: 2020-02-29 | Disposition: A | Discharge status: Home / Self Care | Payer: Medicare HMO | Source: Ambulatory Visit | Attending: Orthopedic Surgery | Admitting: Orthopedic Surgery

## 2020-02-29 DIAGNOSIS — M4807 Spinal stenosis, lumbosacral region: Secondary | ICD-10-CM | POA: Diagnosis not present

## 2020-04-26 ENCOUNTER — Ambulatory Visit: Payer: Medicare HMO | Admitting: Dermatology

## 2020-06-04 ENCOUNTER — Ambulatory Visit: Payer: Medicare HMO | Admitting: Cardiology

## 2020-07-05 ENCOUNTER — Other Ambulatory Visit: Payer: Self-pay

## 2020-07-05 ENCOUNTER — Ambulatory Visit: Payer: Medicare HMO | Admitting: Dermatology

## 2020-07-05 DIAGNOSIS — L578 Other skin changes due to chronic exposure to nonionizing radiation: Secondary | ICD-10-CM | POA: Diagnosis not present

## 2020-07-05 DIAGNOSIS — L821 Other seborrheic keratosis: Secondary | ICD-10-CM

## 2020-07-05 DIAGNOSIS — L570 Actinic keratosis: Secondary | ICD-10-CM

## 2020-07-05 DIAGNOSIS — L820 Inflamed seborrheic keratosis: Secondary | ICD-10-CM

## 2020-07-05 DIAGNOSIS — D229 Melanocytic nevi, unspecified: Secondary | ICD-10-CM

## 2020-07-05 DIAGNOSIS — Z1283 Encounter for screening for malignant neoplasm of skin: Secondary | ICD-10-CM

## 2020-07-05 DIAGNOSIS — D1800 Hemangioma unspecified site: Secondary | ICD-10-CM

## 2020-07-05 DIAGNOSIS — L814 Other melanin hyperpigmentation: Secondary | ICD-10-CM

## 2020-07-05 DIAGNOSIS — D225 Melanocytic nevi of trunk: Secondary | ICD-10-CM

## 2020-07-05 DIAGNOSIS — D485 Neoplasm of uncertain behavior of skin: Secondary | ICD-10-CM

## 2020-07-05 HISTORY — DX: Melanocytic nevi, unspecified: D22.9

## 2020-07-05 NOTE — Progress Notes (Signed)
Follow-Up Visit   Subjective  Cody Herrera is a 75 y.o. male who presents for the following: UBSE (Patient has numerous scattered lesions on the body and he would like a cream to remove them ). He also has a hx of AK's and ISK's.  The patient presents for Upper Body Skin Exam (UBSE) for skin cancer screening and mole check.  The following portions of the chart were reviewed this encounter and updated as appropriate:   Tobacco  Allergies  Meds  Problems  Med Hx  Surg Hx  Fam Hx     Review of Systems:  No other skin or systemic complaints except as noted in HPI or Assessment and Plan.  Objective  Well appearing patient in no apparent distress; mood and affect are within normal limits.  All skin waist up examined.  Objective  Forehead and L ear x 5 (5): Erythematous thin papules/macules with gritty scale.   Objective  Scalp, face, neck, trunk (21): Erythematous keratotic or waxy stuck-on papule or plaque.   Objective  L mid back: 1.1 cm irregular brown macule   Assessment & Plan  AK (actinic keratosis) (5) Forehead and L ear x 5  Fine confluent AK's on the scalp - discussed field tx, recommend PDT of the scalp, forehead, and temples.  Destruction of lesion - Forehead and L ear x 5 Complexity: simple   Destruction method: cryotherapy   Informed consent: discussed and consent obtained   Timeout:  patient name, date of birth, surgical site, and procedure verified Lesion destroyed using liquid nitrogen: Yes   Region frozen until ice ball extended beyond lesion: Yes   Outcome: patient tolerated procedure well with no complications   Post-procedure details: wound care instructions given    Inflamed seborrheic keratosis (21) Scalp, face, neck, trunk  Advised patient that the is no topical treatment that would get rid of these lesions. The best treatment option is treatment with LN2.  Destruction of lesion - Scalp, face, neck, trunk Complexity: simple    Destruction method: cryotherapy   Informed consent: discussed and consent obtained   Timeout:  patient name, date of birth, surgical site, and procedure verified Lesion destroyed using liquid nitrogen: Yes   Region frozen until ice ball extended beyond lesion: Yes   Outcome: patient tolerated procedure well with no complications   Post-procedure details: wound care instructions given    Neoplasm of uncertain behavior of skin L mid back  Epidermal / dermal shaving  Lesion diameter (cm):  1.1 Informed consent: discussed and consent obtained   Timeout: patient name, date of birth, surgical site, and procedure verified   Procedure prep:  Patient was prepped and draped in usual sterile fashion Prep type:  Isopropyl alcohol Anesthesia: the lesion was anesthetized in a standard fashion   Anesthetic:  1% lidocaine w/ epinephrine 1-100,000 buffered w/ 8.4% NaHCO3 Instrument used: flexible razor blade   Hemostasis achieved with: pressure, aluminum chloride and electrodesiccation   Outcome: patient tolerated procedure well   Post-procedure details: sterile dressing applied and wound care instructions given   Dressing type: bandage and petrolatum    Specimen 1 - Surgical pathology Differential Diagnosis: D48.5 r/o dysplastic nevus  Check Margins: No 1.1 cm irregular brown macule  Skin cancer screening   Lentigines - Scattered tan macules - Due to sun exposure - Benign-appering, observe - Recommend daily broad spectrum sunscreen SPF 30+ to sun-exposed areas, reapply every 2 hours as needed. - Call for any changes  Seborrheic Keratoses -  Stuck-on, waxy, tan-brown papules and/or plaques  - Benign-appearing - Discussed benign etiology and prognosis. - Observe - Call for any changes  Melanocytic Nevi - Tan-brown and/or pink-flesh-colored symmetric macules and papules - Benign appearing on exam today - Observation - Call clinic for new or changing moles - Recommend daily use of  broad spectrum spf 30+ sunscreen to sun-exposed areas.   Hemangiomas - Red papules - Discussed benign nature - Observe - Call for any changes  Actinic Damage - Severe, confluent actinic changes with pre-cancerous actinic keratoses  - Severe, chronic, not at goal, secondary to cumulative UV radiation exposure over time - diffuse scaly erythematous macules and papules with underlying dyspigmentation - Discussed Prescription "Field Treatment" for Severe, Chronic Confluent Actinic Changes with Pre-Cancerous Actinic Keratoses Field treatment involves treatment of an entire area of skin that has confluent Actinic Changes (Sun/ Ultraviolet light damage) and PreCancerous Actinic Keratoses by method of PhotoDynamic Therapy (PDT) and/or prescription Topical Chemotherapy agents such as 5-fluorouracil, 5-fluorouracil/calcipotriene, and/or imiquimod.  The purpose is to decrease the number of clinically evident and subclinical PreCancerous lesions to prevent progression to development of skin cancer by chemically destroying early precancer changes that may or may not be visible.  It has been shown to reduce the risk of developing skin cancer in the treated area. As a result of treatment, redness, scaling, crusting, and open sores may occur during treatment course. One or more than one of these methods may be used and may have to be used several times to control, suppress and eliminate the PreCancerous changes. Discussed treatment course, expected reaction, and possible side effects. - Recommend daily broad spectrum sunscreen SPF 30+ to sun-exposed areas, reapply every 2 hours as needed.  - Staying in the shade or wearing long sleeves, sun glasses (UVA+UVB protection) and wide brim hats (4-inch brim around the entire circumference of the hat) are also recommended. - Call for new or changing lesions. - In 4 weeks schedule PDT of the scalp, forehead, and temples.   Skin cancer screening performed today.  Return  in about 1 month (around 08/05/2020) for PDT of scalp/forehead/temples, UBSE in 12 mths.  Luther Redo, CMA, am acting as scribe for Sarina Ser, MD .  Documentation: I have reviewed the above documentation for accuracy and completeness, and I agree with the above.  Sarina Ser, MD

## 2020-07-05 NOTE — Patient Instructions (Addendum)
If you have any questions or concerns for your doctor, please call our main line at [phone] and press option 4 to reach your doctor's medical assistant. If no one answers, please leave a voicemail as directed and we will return your call as soon as possible. Messages left after 4 pm will be answered the following business day.   You may also send [place] a message via [place]. We typically respond to MyChart messages within 1-2 business days.  For prescription refills, please ask your pharmacy to contact our office. Our fax number is [phone].  If you have an urgent issue when the clinic is closed that cannot wait until the next business day, you can page your doctor at the number below.    Please note that while we do our best to be available for urgent issues outside of office hours, we are not available 24/7.   If you have an urgent issue and are unable to reach [place], you may choose to seek medical care at your doctor's office, retail clinic, urgent care center, or emergency room.  If you have a medical emergency, please immediately call 911 or go to the emergency department.  Pager Numbers  - Dr. [name]: [phone]  - Dr. Laurence Ferrari: [phone]  - Dr. Nicole Kindred: [phone]  In the event of inclement weather, please call our main line at [phone] for an update on the status of any delays or closures.  Dermatology Medication Tips: Please keep the boxes that topical medications come in in order to help keep track of the instructions about where and how to use these. Pharmacies typically print the medication instructions only on the boxes and not directly on the medication tubes.   If your medication is too expensive, please contact our office at [phone] option 4 or send [place] a message through [place].   We are unable to tell what your co-pay for medications will be in advance as this is different depending on your insurance coverage. However, we may be able to find a substitute  medication at lower cost or fill out paperwork to get insurance to cover a needed medication.   If a prior authorization is required to get your medication covered by your insurance company, please allow [place] 1-2 business days to complete this process.  Drug prices often vary depending on where the prescription is filled and some pharmacies may offer cheaper prices.  The website www.goodrx.com contains coupons for medications through different pharmacies. The prices here do not account for what the cost may be with help from insurance (it may be cheaper with your insurance), but the website can give you the price if you did not use any insurance.  Wound Care Instructions  1. Cleanse wound gently with soap and water once a day then pat dry with clean gauze. Apply a thing coat of Petrolatum (petroleum jelly, "Vaseline") over the wound (unless you have an allergy to this). We recommend that you use a new, sterile tube of Vaseline. Do not pick or remove scabs. Do not remove the yellow or white "healing tissue" from the base of the wound.  2. Cover the wound with fresh, clean, nonstick gauze and secure with paper tape. You may use Band-Aids in place of gauze and tape if the would is small enough, but would recommend trimming much of the tape off as there is often too much. Sometimes Band-Aids can irritate the skin.  3. You should call the office for your biopsy report after 1 week if  you have not already been contacted.  4. If you experience any problems, such as abnormal amounts of bleeding, swelling, significant bruising, significant pain, or evidence of infection, please call the office immediately.  5. FOR ADULT SURGERY PATIENTS: If you need something for pain relief you may take 1 extra strength Tylenol (acetaminophen) AND 2 Ibuprofen (200mg  each) together every 4 hours as needed for pain. (do not take these if you are allergic to them or if you have a reason you should not take them.) Typically, you  may only need pain medication for 1 to 3 days.    - You can print the associated coupon and take it with your prescription to the pharmacy.  - You may also stop by our office during regular business hours and pick up a GoodRx coupon card.  - If you need your prescription sent electronically to a different pharmacy, notify our office through [facility] or by phone at [phone] option 4.

## 2020-07-09 ENCOUNTER — Encounter: Payer: Self-pay | Admitting: Dermatology

## 2020-07-12 ENCOUNTER — Telehealth: Payer: Self-pay

## 2020-07-12 NOTE — Telephone Encounter (Signed)
Discussed biopsy results with pt, return for recheck January 2022

## 2020-07-12 NOTE — Telephone Encounter (Signed)
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

## 2020-08-08 ENCOUNTER — Ambulatory Visit: Payer: Medicare HMO

## 2021-02-14 ENCOUNTER — Ambulatory Visit: Payer: Medicare HMO | Admitting: Dermatology

## 2021-05-09 ENCOUNTER — Encounter: Payer: Self-pay | Admitting: Dermatology

## 2021-06-05 IMAGING — MR MR LUMBAR SPINE W/O CM
5 series · 31 of 48 positions shown · non-contrast
Comparison: None.

CLINICAL DATA: Spinal stenosis of the lumbosacral region. Numbness
in both feet.

EXAM:
MRI LUMBAR SPINE WITHOUT CONTRAST
TECHNIQUE: Multiplanar, multisequence MR imaging of the lumbar spine was
performed. No intravenous contrast was administered.

[Series 5: T2 · sagittal · 4.0mm · 0.81mm/px · 7 of 17 slices shown (1 of 2)]
[im 1/17]
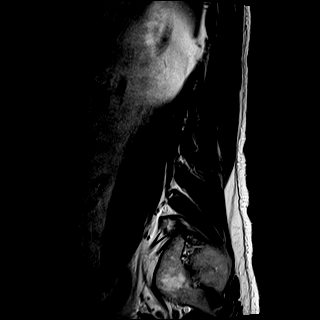
[im 3/17]
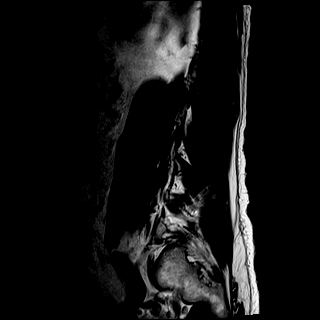
[im 6/17]
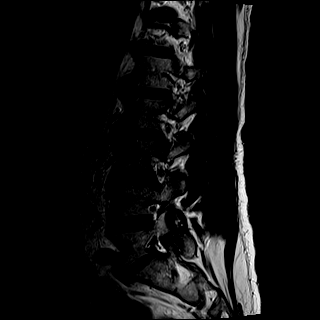
[im 9/17]
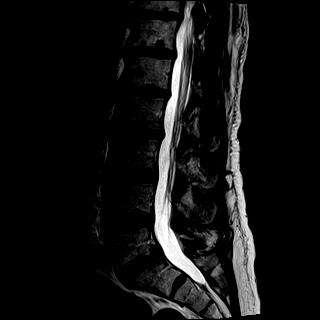
[im 11/17]
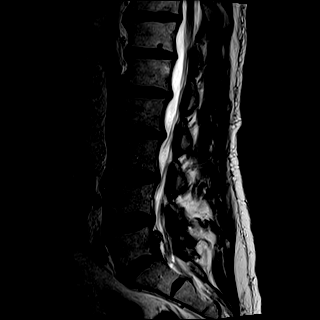
[im 14/17]
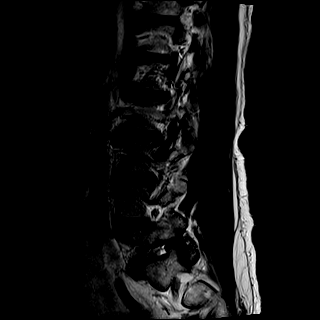
[im 17/17]
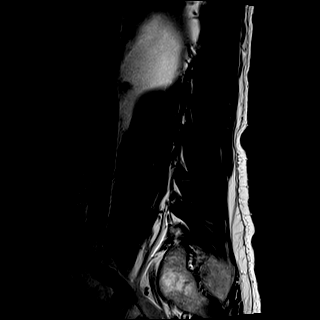

[Series 6: T1 · sagittal · 4.0mm · 0.81mm/px · 7 of 17 slices shown (1 of 2)]
[im 1/17]
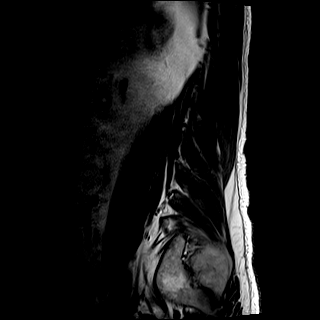
[im 3/17]
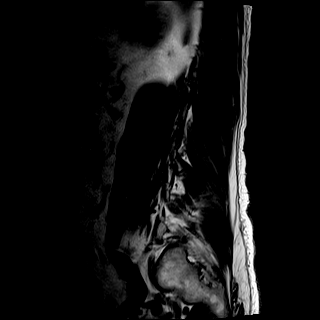
[im 6/17]
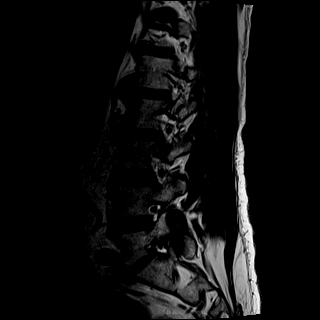
[im 9/17]
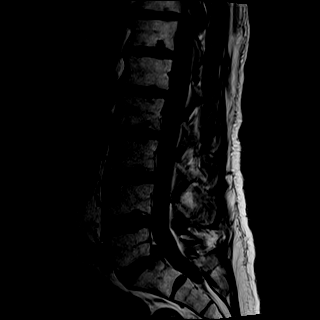
[im 11/17]
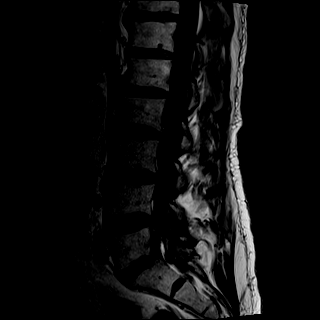
[im 14/17]
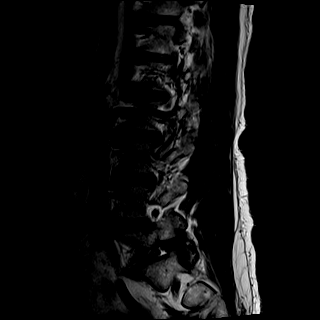
[im 17/17]
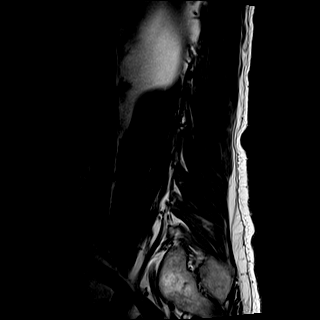

[Series 7: STIR · sagittal · 4.0mm · 0.41mm/px · 1 of 17 slices shown]
[im 1/17]
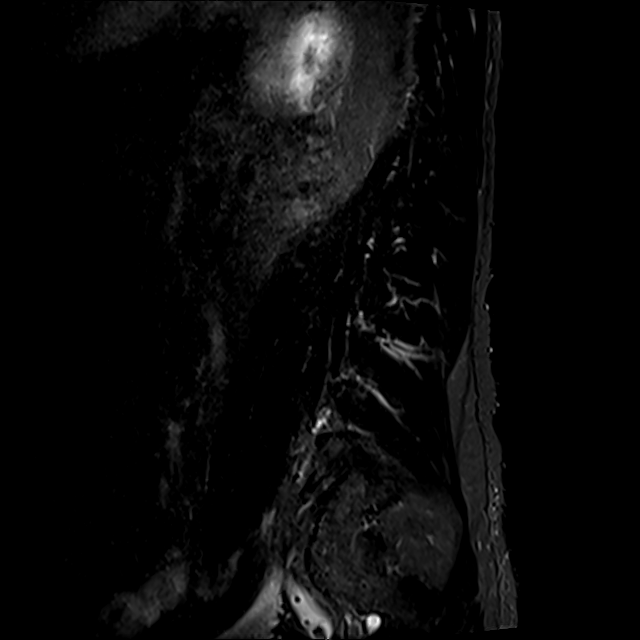

[Series 8: T2 · axial · 4.0mm · 0.78mm/px · z∈[-124,+89]mm · 8 of 38 slices shown (2 of 2)]
[im 1/38]
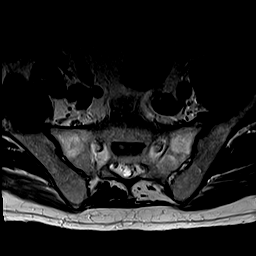
[im 6/38]
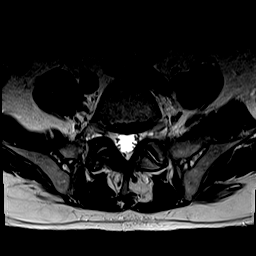
[im 12/38]
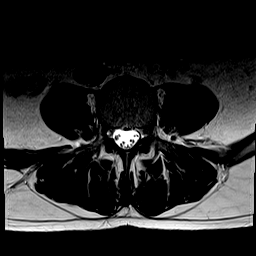
[im 18/38]
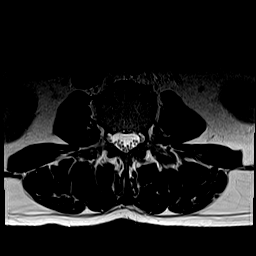
[im 20/38]
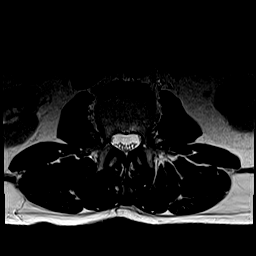
[im 26/38]
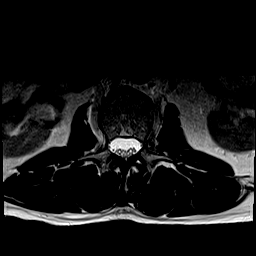
[im 32/38]
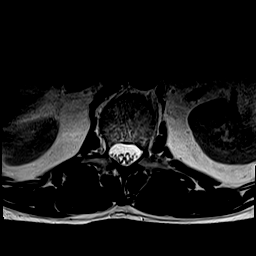
[im 38/38]
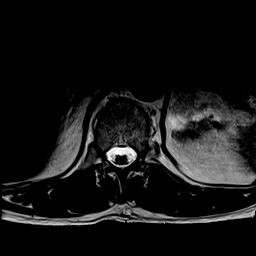

[Series 9: T1 · axial · 4.0mm · 0.39mm/px · z∈[-124,+89]mm · 8 of 38 slices shown (2 of 2)]
[im 1/38]
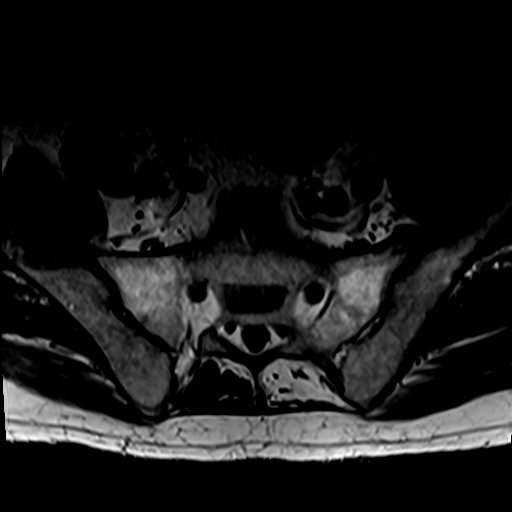
[im 6/38]
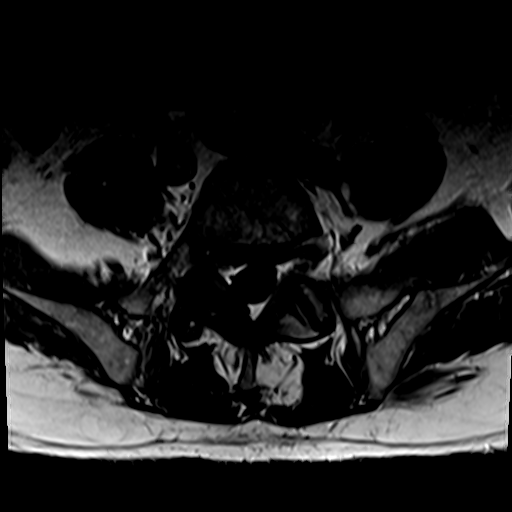
[im 12/38]
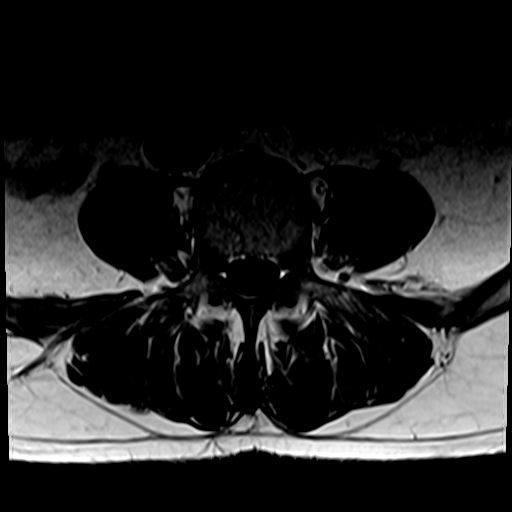
[im 18/38]
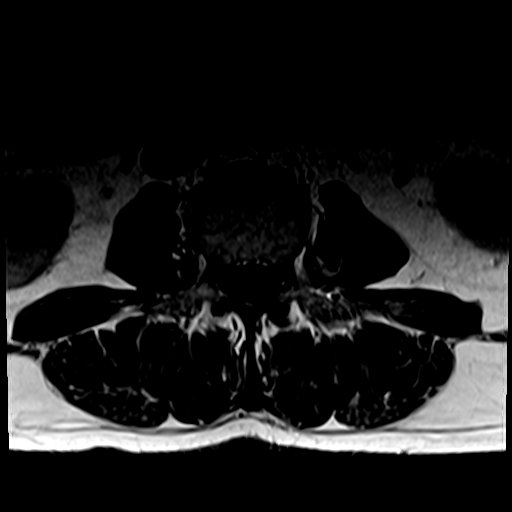
[im 20/38]
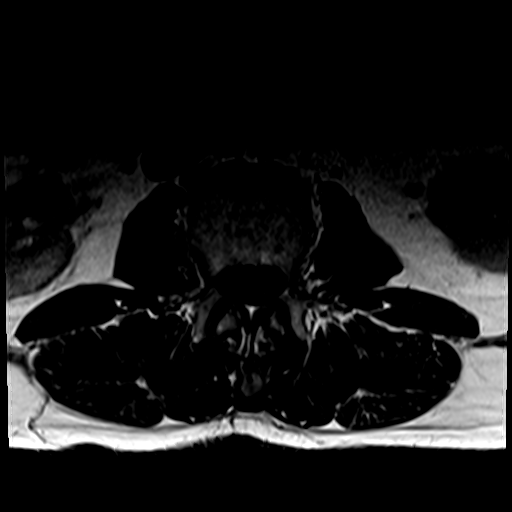
[im 26/38]
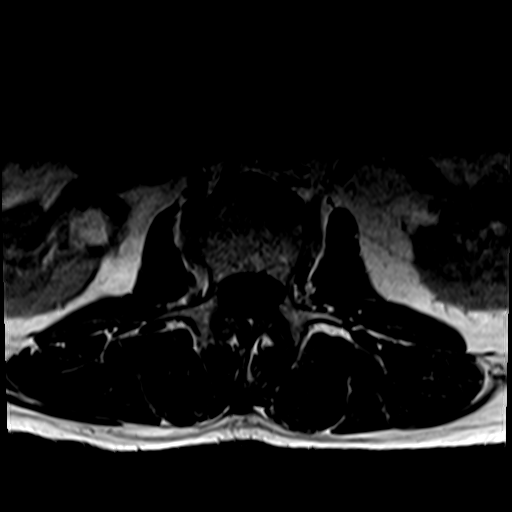
[im 32/38]
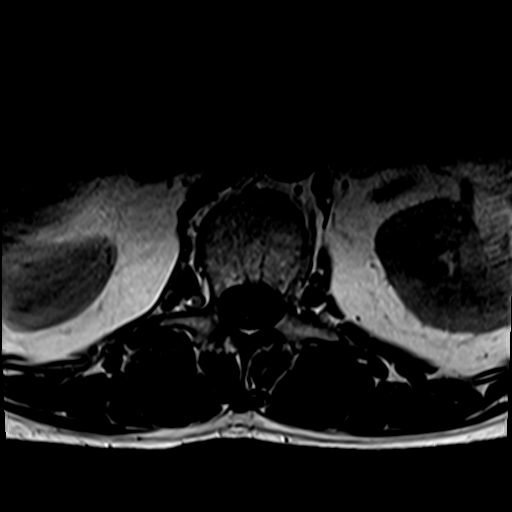
[im 38/38]
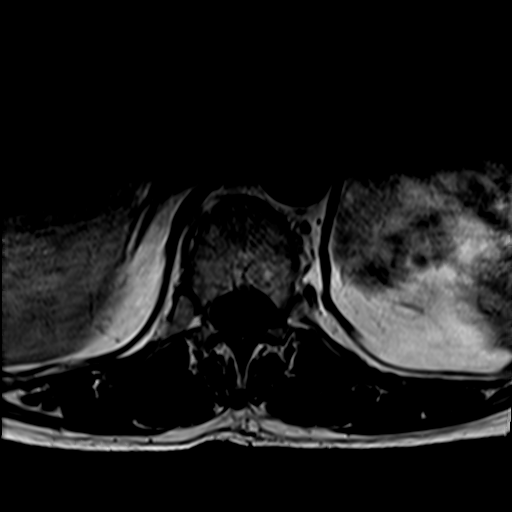

[31 of 48 positions shown; findings below may reference images not displayed]

FINDINGS: Segmentation:  Choose

Alignment:  Physiologic.

Vertebrae:  No fracture, evidence of discitis, or bone lesion.

Conus medullaris and cauda equina: Conus extends to the L1 level.
Conus and cauda equina appear normal.

Paraspinal and other soft tissues: Negative.

Disc levels:

T12-L1: Shallow disc bulge. No significant spinal canal or neural
foraminal stenosis.

L1-2: Shallow disc bulge. No significant spinal canal or neural
foraminal stenosis.

L2-3: Shallow disc bulge and mild facet degenerative changes without
significant spinal canal or neural foraminal stenosis.

L3-4: Shallow disc bulge with superimposed tiny right foraminal disc
protrusion with small annular tear, mild facet degenerative change
and ligamentum flavum redundancy. Findings result in mild right
neural foraminal narrowing. No significant spinal canal stenosis.

L4-5: Shallow disc bulge and moderate facet degenerative changes
with ligamentum flavum redundancy resulting in mild bilateral neural
foraminal narrowing. No significant spinal canal stenosis.

L5-S1: Disc bulge with superimposed superiorly migrating right
central/subarticular disc extrusion causing narrowing of the right
subarticular zone and impinging on the exiting right L5 nerve root.
Moderate facet degenerative changes, right greater than left. Mild
bilateral neural foraminal narrowing. No significant spinal canal
stenosis.
IMPRESSION: 1. Multilevel degenerative changes of the lumbar spine as described
above, worst at L5-S1 where there is a superiorly migrating right
central/subarticular disc extrusion causing narrowing of the right
subarticular zone and impingement on the exiting right L5 nerve
root.
2. No significant spinal canal stenosis at any level.

## 2021-07-11 ENCOUNTER — Ambulatory Visit: Payer: Medicare HMO | Admitting: Dermatology

## 2022-09-29 NOTE — Progress Notes (Signed)
 Cody Herrera is a 77 y.o. male.  The patient presents for multiple medical concerns.  Patient verbally consents to the use of voice recording for the purpose of electronically capturing documentation for the patient encounter.   The patient is diagnosed with prepatellar bursitis, characterized by fluid accumulation above the knee. This condition has been a long-standing issue, causing him discomfort. 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  He has been prescribed lisinopril  20 mg twice daily for hypertension, but he only takes it once daily. His blood pressure readings fluctuate, but are generally satisfactory at home. He expresses a desire to lose 5 to 10 pounds. BP Readings from Last 3 Encounters:  09/29/22 (!) 144/78  01/13/22 (!) 168/84  06/03/21 (!) 170/94    Patient with a history of hyperlipidemia.  The most recent lipid profile:  Lab Results  Component Value Date   HDL 29 01/14/2022   HDL 27 12/24/2020   Lab Results  Component Value Date   LDLCALC 13 01/14/2022   LDLCALC 11 12/24/2020   Lab Results  Component Value Date   TRIG 285 01/14/2022   TRIG 301 12/24/2020   Lab Results  Component Value Date   CHOLTOTAL 99 01/14/2022   CHOLTOTAL 98 12/24/2020  . The patient is currently on rosuvastatin  40 mg together with a low fat diet and dietary fiber.  The patient reports taking medications daily, denies right upper quadrant abdominal pain, myalgias, been jaundiced.   He is due for a PSA test.    No Known Allergies  Prior to Admission medications   Medication Sig Taking? Last Dose  aspirin  81 MG EC tablet Take 81 mg by mouth once daily. Yes PRN Not Currently Taking  atenoloL  (TENORMIN ) 100 MG tablet Take 1 tablet (100 mg total) by mouth once daily Yes   rosuvastatin  (CRESTOR ) 40 MG tablet Take 1 tablet (40 mg total) by mouth once daily Yes   lisinopriL  (ZESTRIL ) 40 MG tablet Take 1 tablet (40 mg total) by  mouth once daily    predniSONE  (DELTASONE ) 10 mg tablet pack 6 day taper. Take as directed with food Patient not taking: Reported on 01/13/2022  Not Taking    Current medications, allergies, problem list, personally reviewed on Epic today.  Objective:   Vitals:   09/29/22 0810  BP: (!) 144/78  Pulse: 71  Weight: 90.7 kg (200 lb)  Height: 167.6 cm (5' 6)  PainSc: 0-No pain   Body mass index is 32.28 kg/m.  GENERAL: Alert and oriented, in no acute distress. SKIN: No rash, lesions, normal color, no diaphoresis. HEENT: [place]/AT, clear sclera, conjunctiva clear, canals clear, TM normal.  Other exam deferred today due to COVID. NECK: Supple, no masses; no lymphadenopathy. LUNGS: Respirations unlabored; clear to auscultation bilaterally, no wheezing, rales or rhonchi.  CARDIOVASCULAR: Regular, rate, and rhythm without murmurs, gallops or rubs. ABDOMEN: Soft; nontender; nondistended; no masses or organomegaly. EXTREMITIES: Suprapatellar knee effusion. NEUROLOGIC: Normal mentation; moves all extremities well, sensation normal.   Assessment / Plan:   Diagnoses and all orders for this visit:  Hypercholesterolemia -     rosuvastatin  (CRESTOR ) 40 MG tablet; Take 1 tablet (40 mg total) by mouth once daily  Essential hypertension -     atenoloL  (TENORMIN ) 100 MG tablet; Take 1 tablet (100 mg total) by mouth once daily -     lisinopriL  (ZESTRIL ) 40 MG tablet; Take 1 tablet (40 mg total) by mouth once daily -  Basic Metabolic Panel (BMP); Future  Screening for prostate cancer -     Prostate Specific Antigen (PSA) Total W/Reflex Prostate Specific Antigen Free, Screen; Future  Knee effusion, right   1. Essential Hypertension. The blood pressure as patient reports checking at home appears to be appropriately controlled.  Here is high. Continue with current medications.  Those were renewed.  See medications list. Follow up in 3 months.  2. Hyperlipidemia. The most recent lipid  profile shows to be properly controlled. The patient is to continue with current regiment (see medications list) together with a low-fat diet, dietary fiber, exercise. Follow up in 3 months.  3. 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 He was offered an appointment for aspiration later in the afternoon or the next day, but he opted to go to another place to get this done.  4. Scan for prostate cancer.  He requested screening for prostate cancer. A PSA test will be performed as part of his blood work.  Follow-up The patient will follow up in 3 months.  This visit was coded based on medical decision making (MDM).   A copy of instructions have been given to the patient or responsible adult who demonstrated the ability to learn, asked appropriate questions, and verbalized understanding of the plan of care.  There were no barriers to learning identified.   Document Attestation: LILLETTE Mitzie Dubs, have reviewed and updated documentation for MARIO E OLMEDO, MD, utilizing [employer].    I personally saw and evaluated the patient. I reviewed the encounter specialist portions of this document for correct information and accuracy.

## 2022-10-01 ENCOUNTER — Ambulatory Visit: Payer: Medicare HMO | Admitting: Dermatology

## 2022-10-01 ENCOUNTER — Encounter: Payer: Self-pay | Admitting: Dermatology

## 2022-10-01 VITALS — BP 164/91

## 2022-10-01 DIAGNOSIS — L820 Inflamed seborrheic keratosis: Secondary | ICD-10-CM

## 2022-10-01 DIAGNOSIS — L814 Other melanin hyperpigmentation: Secondary | ICD-10-CM

## 2022-10-01 DIAGNOSIS — L578 Other skin changes due to chronic exposure to nonionizing radiation: Secondary | ICD-10-CM

## 2022-10-01 DIAGNOSIS — Z1283 Encounter for screening for malignant neoplasm of skin: Secondary | ICD-10-CM | POA: Diagnosis not present

## 2022-10-01 DIAGNOSIS — Z86018 Personal history of other benign neoplasm: Secondary | ICD-10-CM | POA: Diagnosis not present

## 2022-10-01 NOTE — Patient Instructions (Addendum)
Cryotherapy Aftercare  Wash gently with soap and water everyday.   Apply Vaseline and Band-Aid daily until healed.   For the Seborrheic Keratosis on body may try: Apply diclofenac (voltaren) gel twice a day to spots.       Due to recent changes in healthcare laws, you may see results of your pathology and/or laboratory studies on MyChart before the doctors have had a chance to review them. We understand that in some cases there may be results that are confusing or concerning to you. Please understand that not all results are received at the same time and often the doctors may need to interpret multiple results in order to provide you with the best plan of care or course of treatment. Therefore, we ask that you please give [place] 2 business days to thoroughly review all your results before contacting the office for clarification. Should we see a critical lab result, you will be contacted sooner.   If You Need Anything After Your Visit  If you have any questions or concerns for your doctor, please call our main line at [phone] and press option 4 to reach your doctor's medical assistant. If no one answers, please leave a voicemail as directed and we will return your call as soon as possible. Messages left after 4 pm will be answered the following business day.   You may also send [place] a message via MyChart. We typically respond to MyChart messages within 1-2 business days.  For prescription refills, please ask your pharmacy to contact our office. Our fax number is [phone].  If you have an urgent issue when the clinic is closed that cannot wait until the next business day, you can page your doctor at the number below.    Please note that while we do our best to be available for urgent issues outside of office hours, we are not available 24/7.   If you have an urgent issue and are unable to reach [place], you may choose to seek medical care at your doctor's office, retail clinic, urgent care  center, or emergency room.  If you have a medical emergency, please immediately call 911 or go to the emergency department.  Pager Numbers  - Dr. Gwen Pounds: [phone]  - Dr. Roseanne Reno: [phone]  - Dr. Katrinka Blazing: [phone]   In the event of inclement weather, please call our main line at [phone] for an update on the status of any delays or closures.  Dermatology Medication Tips: Please keep the boxes that topical medications come in in order to help keep track of the instructions about where and how to use these. Pharmacies typically print the medication instructions only on the boxes and not directly on the medication tubes.   If your medication is too expensive, please contact our office at [phone] option 4 or send [place] a message through MyChart.   We are unable to tell what your co-pay for medications will be in advance as this is different depending on your insurance coverage. However, we may be able to find a substitute medication at lower cost or fill out paperwork to get insurance to cover a needed medication.   If a prior authorization is required to get your medication covered by your insurance company, please allow [place] 1-2 business days to complete this process.  Drug prices often vary depending on where the prescription is filled and some pharmacies may offer cheaper prices.  The website www.goodrx.com contains coupons for medications through different pharmacies. The prices here do not  account for what the cost may be with help from insurance (it may be cheaper with your insurance), but the website can give you the price if you did not use any insurance.  - You can print the associated coupon and take it with your prescription to the pharmacy.  - You may also stop by our office during regular business hours and pick up a GoodRx coupon card.  - If you need your prescription sent electronically to a different pharmacy, notify our office through [facility] or by  phone at [phone] option 4.     Si Usted Necesita Algo Despus de Su Visita  Tambin puede enviarnos un mensaje a travs de Clinical cytogeneticist. Por lo general respondemos a los mensajes de MyChart en el transcurso de 1 a 2 das hbiles.  Para renovar recetas, por favor pida a su farmacia que se ponga en contacto con nuestra oficina. Annie Sable de fax es [place] [phone].  Si tiene un asunto urgente cuando la clnica est cerrada y que no puede esperar hasta el siguiente da hbil, puede llamar/localizar a su doctor(a) al nmero que aparece a continuacin.   Por favor, tenga en cuenta que aunque hacemos todo lo posible para estar disponibles para asuntos urgentes fuera del horario de [place], no estamos disponibles las 24 horas del da, los 7 [address]  [address] de la [place].   Si tiene un problema urgente y no puede comunicarse con nosotros, puede optar por buscar atencin mdica  en el consultorio de su doctor(a), en una clnica privada, en un centro de atencin urgente o en una sala de emergencias.  Si tiene Engineer, drilling, por favor llame inmediatamente al 911 o vaya a la sala de emergencias.  Nmeros de bper  - Dr. Gwen Pounds: [phone]  - Dra. Roseanne Reno: [phone]  - Dr. Katrinka Blazing: [phone]   En caso de inclemencias del tiempo, por favor llame a Lacy Duverney principal al [phone] para una actualizacin sobre el [place] de cualquier retraso o cierre.  Consejos para la medicacin en dermatologa: Por favor, guarde las cajas en las que vienen los medicamentos de uso tpico para ayudarle a seguir las instrucciones sobre dnde y cmo usarlos. Las farmacias generalmente imprimen las instrucciones del medicamento slo en las cajas y no directamente en los tubos del [place].   Si su medicamento es muy caro, por favor, pngase en contacto con Rolm [place] llamando al [phone] y presione la opcin 4 o envenos un mensaje a travs de Clinical cytogeneticist.   No podemos decirle cul ser su copago  por los medicamentos por adelantado ya que esto es diferente dependiendo de la cobertura de su seguro. Sin embargo, es posible que podamos encontrar un medicamento sustituto a Audiological scientist un formulario para que el seguro cubra el medicamento que se considera necesario.   Si se requiere una autorizacin previa para que su compaa de seguros [place] su medicamento, por favor permtanos de 1 a 2 das hbiles para completar [address]  Los precios de los medicamentos varan con frecuencia dependiendo del Environmental consultant de dnde se surte la receta y alguna farmacias pueden ofrecer precios ms baratos.  El sitio web www.goodrx.com tiene cupones para medicamentos de Health and safety inspector. Los precios aqu no tienen en cuenta lo que podra costar con la ayuda del seguro (puede ser ms barato con su seguro), pero el sitio web puede darle el precio si no utiliz Tourist information centre manager.  - Puede imprimir el cupn correspondiente y llevarlo con su receta a la farmacia.  -  Tambin puede pasar por nuestra oficina durante el horario de atencin regular y Education officer, museum una tarjeta de cupones de GoodRx.  - Si necesita que su receta se enve electrnicamente a una farmacia diferente, informe a nuestra oficina a travs de MyChart de [place] o por telfono llamando al [phone] y presione la opcin 4.

## 2022-10-01 NOTE — Progress Notes (Signed)
Follow-Up Visit   Subjective  Cody Herrera is a 77 y.o. male who presents for the following: Skin Cancer Screening and Upper Body Skin Exam hx of Aks, Dysplastic Nevus, check growths arms, back, one on back may be itchy  The patient presents for Upper Body Skin Exam (UBSE) for skin cancer screening and mole check. The patient has spots, moles and lesions to be evaluated, some may be new or changing and the patient may have concern these could be cancer.    The following portions of the chart were reviewed this encounter and updated as appropriate: medications, allergies, medical history  Review of Systems:  No other skin or systemic complaints except as noted in HPI or Assessment and Plan.  Objective  Well appearing patient in no apparent distress; mood and affect are within normal limits.  All skin waist up examined and legs. Relevant physical exam findings are noted in the Assessment and Plan.  back x 3, scalp x 2, face x 3 (8) Stuck on waxy paps with erythema    Assessment & Plan   Inflamed seborrheic keratosis (8) back x 3, scalp x 2, face x 3  Symptomatic, irritating, patient would like treated.  Apply diclofenac (voltaren) gel twice a day to spots.    Destruction of lesion - back x 3, scalp x 2, face x 3 (8) Complexity: simple   Destruction method: cryotherapy   Informed consent: discussed and consent obtained   Timeout:  patient name, date of birth, surgical site, and procedure verified Lesion destroyed using liquid nitrogen: Yes   Region frozen until ice ball extended beyond lesion: Yes   Outcome: patient tolerated procedure well with no complications   Post-procedure details: wound care instructions given    Skin cancer screening  Actinic skin damage  History of dysplastic nevus  Lentigo   Skin cancer screening performed today.  Actinic Damage - Chronic condition, secondary to cumulative UV/sun exposure - diffuse scaly erythematous  macules with underlying dyspigmentation - Recommend daily broad spectrum sunscreen SPF 30+ to sun-exposed areas, reapply every 2 hours as needed.  - Staying in the shade or wearing long sleeves, sun glasses (UVA+UVB protection) and wide brim hats (4-inch brim around the entire circumference of the hat) are also recommended for sun protection.  - Call for new or changing lesions.  Lentigines, Seborrheic Keratoses, Hemangiomas - Benign normal skin lesions - Benign-appearing - Call for any changes  Melanocytic Nevi - Tan-brown and/or pink-flesh-colored symmetric macules and papules - Benign appearing on exam today - Observation - Call clinic for new or changing moles - Recommend daily use of broad spectrum spf 30+ sunscreen to sun-exposed areas.   HISTORY OF DYSPLASTIC NEVUS No evidence of recurrence today Recommend regular full body skin exams Recommend daily broad spectrum sunscreen SPF 30+ to sun-exposed areas, reapply every 2 hours as needed.  Call if any new or changing lesions are noted between office visits  - L mid back severe atypia, clear with bx  HISTORY OF PRECANCEROUS ACTINIC KERATOSIS - site(s) of PreCancerous Actinic Keratosis clear today. - these may recur and new lesions may form requiring treatment to prevent transformation into skin cancer - observe for new or changing spots and contact [facility] for appointment if occur - photoprotection with sun protective clothing; sunglasses and broad spectrum sunscreen with SPF of at least 30 + and frequent self skin exams recommended - yearly exams by a dermatologist recommended for persons with history of PreCancerous Actinic Keratoses  Return in about 1 year (around 10/01/2023) for UBSE, Hx of Dysplastic nevi, Hx of AKs.  I, Ardis Rowan, RMA, am acting as scribe for Armida Sans, MD .   Documentation: I have reviewed the above documentation for accuracy and completeness, and I agree with the above.  Armida Sans, MD

## 2022-10-09 ENCOUNTER — Encounter: Payer: Self-pay | Admitting: Dermatology

## 2023-01-29 NOTE — Progress Notes (Signed)
 Cody Herrera is a 77 y.o. male here for established patient visit to discuss:  History of Present Illness:   1. Sinusitis: Comes in with 3 weeks of nasal congestion, rhinorrhea, postnasal drainage, excessive throat clearing, productive cough, bilateral facial pain and pressure over the past 3 weeks.  No definite sick contacts with similar symptoms.  Denies fever, chills, chest pain, dyspnea, wheezing, stridor, drooling, hemoptysis, sore throat, swallowing difficulty, conjunctivitis, otalgia, nausea, vomiting, diarrhea, abdominal pain.   2. Skin lesion left side of face: Comes in today with reports of a hard bump at the left side of the face for a while now - several months at the very least.  States that the area has gradually increased in size, has become scaly, slightly red as well.  No pain or itching at the site.  Does not recall trauma to the site.  He has not been evaluated for this previously.  States that he tried to freeze the area at home without success.  He does have a dermatologist however he is looking to establish with a different dermatologist, has an appointment for next year.  No drainage from the site.  Denies fever, chills, fatigue, malaise, lymphadenopathy.   The following portions of the patient's history were reviewed and updated as appropriate.   Past Medical History:   Past Medical History:  Diagnosis Date  . Arthritis    knee  . Coronary artery disease   . Hyperlipidemia   . Hypertension   . Murmur      Past Surgical History:   Past Surgical History:  Procedure Laterality Date  . CARDIAC CATHETERIZATION    . CORONARY ANGIOPLASTY    . CORONARY STENT PLACEMENT     x3     Allergies:   No Known Allergies   Medications:   Prior to Admission medications   Medication Sig Taking? Last Dose  aspirin  81 MG EC tablet Take 81 mg by mouth once daily. Yes Taking  atenoloL  (TENORMIN ) 100 MG tablet Take 1 tablet (100 mg total) by mouth once daily  Yes Taking  lisinopriL  (ZESTRIL ) 40 MG tablet Take 1 tablet (40 mg total) by mouth once daily Yes Taking  rosuvastatin  (CRESTOR ) 40 MG tablet Take 1 tablet (40 mg total) by mouth once daily Yes Taking  amoxicillin-clavulanate (AUGMENTIN) 875-125 mg tablet Take 1 tablet (875 mg total) by mouth every 12 (twelve) hours for 7 days    fluticasone propionate (FLONASE) 50 mcg/actuation nasal spray Place 1 spray into both nostrils 2 (two) times daily    predniSONE  (DELTASONE ) 20 MG tablet Take 1 tablet (20 mg total) by mouth once daily for 5 days    promethazine-dextromethorphan (PROMETHAZINE-DM) 6.25-15 mg/5 mL syrup Take 5 mLs by mouth every 6 (six) hours as needed       Family History:   Family History  Problem Relation Name Age of Onset  . Myocardial Infarction (Heart attack) Mother    . Heart disease Mother    . Myocardial Infarction (Heart attack) Father    . Heart disease Father    . Myocardial Infarction (Heart attack) Sister    . Heart disease Sister    . Myocardial Infarction (Heart attack) Brother    . Heart disease Brother    . Coronary Artery Disease (Blocked arteries around heart) Brother    . Heart disease Sister    . High blood pressure (Hypertension) Other    . Hyperlipidemia (Elevated cholesterol) Other    . Diabetes type II Neg  Hx       Social History:   Social History   Socioeconomic History  . Marital status: Married  . Number of children: 2  Occupational History  . Occupation: maintenance    Comment: [employer]  Tobacco Use  . Smoking status: Never  . Smokeless tobacco: Never  Vaping Use  . Vaping status: Never Used  Substance and Sexual Activity  . Alcohol use: Yes  . Drug use: No  . Sexual activity: Not Currently  Social History Narrative   Divorced several years. Has two grown children. Lives by self. 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 Hobbies: Travel-[place], [place], fishes.     Review of Systems:   As per  HPI   Vital Signs:   Vitals:   01/29/23 0809 01/29/23 0813  BP: (!) 172/79 (!) 170/104  Pulse: 81   Temp: 36.7 C (98 F)   SpO2: 96%   Weight: 90.7 kg (200 lb)   Height: 167.6 cm (5' 6)      Body mass index is 32.28 kg/m.   Physical Exam:   General:  Well appearing, pleasant, no acute distress,  Eyes:  No conjunctivitis or drainage Ears:  Tympanic membranes clear bilaterally. Canals clear bilaterally.  Nose: Moderate congestion.  Bilateral frontal, ethmoid and maxillary sinus tenderness to palpation. Mouth:  Moist mucous membranes without erythema or exudate or tonsillar hypertrophy.  Postnasal drainage noted. Neck:  No lymphadenopathy Lungs:  Clear to auscultation bilaterally without wheeze, rale or rhonchi. Normal work of breathing Cardiovascular:  Regular rate and rhythm Skin:  Warm and dry with good turgor.  0.5 x 0.75 cm scaly/slightly hyperkeratotic, minimally erythematous, nodular papule without central ulceration and without telangiectasia at the left side of the face (nontender) Neurologic:  Alert     Assessment and Plan:   1. Acute bacterial sinusitis  (primary encounter diagnosis): Acute. Given duration of symptoms will cover for bacterial etiology with Augmentin as per orders.  Fluticasone nasal spray for nasal symptoms.  Promethazine dextromethorphan for cough and congestion.  Initially prescribed prednisone  however patient states that prednisone  causes facial redness; added to medication intolerance list, canceled the prescription for prednisone .  Follow-up with PCP if not improved.. Handout given.  Red flags reviewed.   2. Skin nodule: Chronic.  Uncertain etiology however concern for possible squamous cell carcinoma given appearance as above.  Will refer to dermatology for further evaluation and management (patient requests referral to different dermatology from previous-referral placed).    Orders for this Visit:   Diagnoses and all orders for this  visit:  Acute bacterial sinusitis  Other orders -     amoxicillin-clavulanate (AUGMENTIN) 875-125 mg tablet; Take 1 tablet (875 mg total) by mouth every 12 (twelve) hours for 7 days -     promethazine-dextromethorphan (PROMETHAZINE-DM) 6.25-15 mg/5 mL syrup; Take 5 mLs by mouth every 6 (six) hours as needed -     fluticasone propionate (FLONASE) 50 mcg/actuation nasal spray; Place 1 spray into both nostrils 2 (two) times daily -     Discontinue: predniSONE  (DELTASONE ) 20 MG tablet; Take 1 tablet (20 mg total) by mouth once daily for 5 days     Portions of the record may have been created with voice recognition software. Occasional wrong-word or 'sound-a-like' substitutions may have occurred due to the inherent limitations of voice recognition software.  Read the chart carefully and recognize, using context, where substitutions may have occurred.

## 2023-05-08 NOTE — Progress Notes (Signed)
 [facility] [employer] was able to reach the patient by phone call.  The details of interventions are as follows:  Preventive Care-     05/08/2023   11:59 AM  Gap Closure  Age Group: Adult  Adult Primary Care Office Visit Patient declined scheduling - Noted in Specialty Comments  Comments: pts stated he has a dr kerrin bother him any more  Adult Yearly Physical Declination Patient Preference/Not Interested    Cody Herrera  For more information on [facility] services, click here.

## 2023-08-17 ENCOUNTER — Inpatient Hospital Stay
Admission: EM | Admit: 2023-08-17 | Discharge: 2023-08-20 | DRG: 321 | Disposition: A | Discharge status: Home / Self Care | Attending: Student | Admitting: Student

## 2023-08-17 ENCOUNTER — Inpatient Hospital Stay (HOSPITAL_COMMUNITY)
Admit: 2023-08-17 | Discharge: 2023-08-17 | Disposition: A | Discharge status: Home / Self Care | Attending: Cardiology | Admitting: Cardiology

## 2023-08-17 ENCOUNTER — Encounter: Payer: Self-pay | Admitting: Cardiology

## 2023-08-17 ENCOUNTER — Encounter
Admission: EM | Disposition: A | Discharge status: Home / Self Care | Payer: Self-pay | Source: Home / Self Care | Attending: Student

## 2023-08-17 DIAGNOSIS — I5021 Acute systolic (congestive) heart failure: Secondary | ICD-10-CM | POA: Diagnosis not present

## 2023-08-17 DIAGNOSIS — R079 Chest pain, unspecified: Secondary | ICD-10-CM | POA: Diagnosis present

## 2023-08-17 DIAGNOSIS — R002 Palpitations: Secondary | ICD-10-CM | POA: Diagnosis present

## 2023-08-17 DIAGNOSIS — I77810 Thoracic aortic ectasia: Secondary | ICD-10-CM | POA: Diagnosis present

## 2023-08-17 DIAGNOSIS — Z7902 Long term (current) use of antithrombotics/antiplatelets: Secondary | ICD-10-CM | POA: Diagnosis not present

## 2023-08-17 DIAGNOSIS — I252 Old myocardial infarction: Secondary | ICD-10-CM | POA: Diagnosis not present

## 2023-08-17 DIAGNOSIS — I2109 ST elevation (STEMI) myocardial infarction involving other coronary artery of anterior wall: Principal | ICD-10-CM | POA: Diagnosis present

## 2023-08-17 DIAGNOSIS — I213 ST elevation (STEMI) myocardial infarction of unspecified site: Principal | ICD-10-CM

## 2023-08-17 DIAGNOSIS — E872 Acidosis, unspecified: Secondary | ICD-10-CM | POA: Diagnosis present

## 2023-08-17 DIAGNOSIS — K529 Noninfective gastroenteritis and colitis, unspecified: Secondary | ICD-10-CM | POA: Diagnosis present

## 2023-08-17 DIAGNOSIS — I110 Hypertensive heart disease with heart failure: Secondary | ICD-10-CM | POA: Diagnosis present

## 2023-08-17 DIAGNOSIS — M7981 Nontraumatic hematoma of soft tissue: Secondary | ICD-10-CM | POA: Diagnosis present

## 2023-08-17 DIAGNOSIS — E785 Hyperlipidemia, unspecified: Secondary | ICD-10-CM | POA: Diagnosis present

## 2023-08-17 DIAGNOSIS — I249 Acute ischemic heart disease, unspecified: Secondary | ICD-10-CM | POA: Diagnosis present

## 2023-08-17 DIAGNOSIS — I10 Essential (primary) hypertension: Secondary | ICD-10-CM

## 2023-08-17 DIAGNOSIS — I2510 Atherosclerotic heart disease of native coronary artery without angina pectoris: Secondary | ICD-10-CM | POA: Diagnosis present

## 2023-08-17 DIAGNOSIS — I5023 Acute on chronic systolic (congestive) heart failure: Secondary | ICD-10-CM | POA: Diagnosis present

## 2023-08-17 DIAGNOSIS — Z8249 Family history of ischemic heart disease and other diseases of the circulatory system: Secondary | ICD-10-CM | POA: Diagnosis not present

## 2023-08-17 DIAGNOSIS — I255 Ischemic cardiomyopathy: Secondary | ICD-10-CM | POA: Diagnosis present

## 2023-08-17 DIAGNOSIS — S5011XA Contusion of right forearm, initial encounter: Secondary | ICD-10-CM | POA: Diagnosis present

## 2023-08-17 DIAGNOSIS — E7800 Pure hypercholesterolemia, unspecified: Secondary | ICD-10-CM | POA: Diagnosis present

## 2023-08-17 DIAGNOSIS — I5181 Takotsubo syndrome: Secondary | ICD-10-CM | POA: Diagnosis present

## 2023-08-17 DIAGNOSIS — Z79899 Other long term (current) drug therapy: Secondary | ICD-10-CM | POA: Diagnosis not present

## 2023-08-17 DIAGNOSIS — Z7982 Long term (current) use of aspirin: Secondary | ICD-10-CM

## 2023-08-17 DIAGNOSIS — R112 Nausea with vomiting, unspecified: Secondary | ICD-10-CM | POA: Diagnosis present

## 2023-08-17 HISTORY — PX: LEFT HEART CATH AND CORONARY ANGIOGRAPHY: CATH118249

## 2023-08-17 HISTORY — PX: CORONARY/GRAFT ACUTE MI REVASCULARIZATION: CATH118305

## 2023-08-17 LAB — ECHOCARDIOGRAM COMPLETE
AR max vel: 4.2 cm2
AV Area VTI: 4.67 cm2
AV Area mean vel: 4.04 cm2
AV Mean grad: 2 mmHg
AV Peak grad: 3.9 mmHg
Ao pk vel: 0.99 m/s
Area-P 1/2: 8.43 cm2
Calc EF: 31.9 %
Height: 67 in
S' Lateral: 3.29 cm
Single Plane A2C EF: 49.8 %
Single Plane A4C EF: 16.4 %
Weight: 3158.4 [oz_av]

## 2023-08-17 LAB — COMPREHENSIVE METABOLIC PANEL WITH GFR
ALT: 28 U/L (ref 0–44)
AST: 63 U/L — ABNORMAL HIGH (ref 15–41)
Albumin: 4 g/dL (ref 3.5–5.0)
Alkaline Phosphatase: 68 U/L (ref 38–126)
Anion gap: 14 (ref 5–15)
BUN: 19 mg/dL (ref 8–23)
CO2: 25 mmol/L (ref 22–32)
Calcium: 9 mg/dL (ref 8.9–10.3)
Chloride: 101 mmol/L (ref 98–111)
Creatinine, Ser: 0.92 mg/dL (ref 0.61–1.24)
GFR, Estimated: >60 mL/min (ref >60)
Glucose, Bld: 163 mg/dL — ABNORMAL HIGH (ref 70–99)
Potassium: 4 mmol/L (ref 3.5–5.1)
Sodium: 140 mmol/L (ref 135–145)
Total Bilirubin: 0.8 mg/dL (ref 0.0–1.2)
Total Protein: 6.9 g/dL (ref 6.5–8.1)

## 2023-08-17 LAB — CG4 I-STAT (LACTIC ACID)
Lactic Acid, Venous: 3.4 mmol/L (ref 0.5–1.9)
Lactic Acid, Venous: 2.9 mmol/L (ref 0.5–1.9)

## 2023-08-17 LAB — LIPID PANEL
Cholesterol: 98 mg/dL (ref 0–200)
HDL: 37 mg/dL — ABNORMAL LOW (ref >40)
LDL Cholesterol: 45 mg/dL (ref 0–99)
Total CHOL/HDL Ratio: 2.6 ratio
Triglycerides: 80 mg/dL (ref <150)
VLDL: 16 mg/dL (ref 0–40)

## 2023-08-17 LAB — CBC WITH DIFFERENTIAL/PLATELET
Abs Immature Granulocytes: 0.05 K/uL (ref 0.00–0.07)
Basophils Absolute: 0 K/uL (ref 0.0–0.1)
Basophils Relative: 0 %
Eosinophils Absolute: 0 K/uL (ref 0.0–0.5)
Eosinophils Relative: 0 %
HCT: 45.9 % (ref 39.0–52.0)
Hemoglobin: 15 g/dL (ref 13.0–17.0)
Immature Granulocytes: 0 %
Lymphocytes Relative: 2 %
Lymphs Abs: 0.4 K/uL — ABNORMAL LOW (ref 0.7–4.0)
MCH: 29.8 pg (ref 26.0–34.0)
MCHC: 32.7 g/dL (ref 30.0–36.0)
MCV: 91.3 fL (ref 80.0–100.0)
Monocytes Absolute: 0.6 K/uL (ref 0.1–1.0)
Monocytes Relative: 4 %
Neutro Abs: 13.8 K/uL — ABNORMAL HIGH (ref 1.7–7.7)
Neutrophils Relative %: 94 %
Platelets: 203 K/uL (ref 150–400)
RBC: 5.03 MIL/uL (ref 4.22–5.81)
RDW: 12.8 % (ref 11.5–15.5)
WBC: 14.8 K/uL — ABNORMAL HIGH (ref 4.0–10.5)
nRBC: 0 % (ref 0.0–0.2)

## 2023-08-17 LAB — BRAIN NATRIURETIC PEPTIDE: B Natriuretic Peptide: 35.5 pg/mL (ref 0.0–100.0)

## 2023-08-17 LAB — TROPONIN I (HIGH SENSITIVITY)
Troponin I (High Sensitivity): 13252 ng/L (ref <18)
Troponin I (High Sensitivity): 23394 ng/L (ref <18)

## 2023-08-17 LAB — MRSA NEXT GEN BY PCR, NASAL: MRSA by PCR Next Gen: NOT DETECTED

## 2023-08-17 LAB — MAGNESIUM: Magnesium: 1.9 mg/dL (ref 1.7–2.4)

## 2023-08-17 LAB — LACTIC ACID, PLASMA: Lactic Acid, Venous: 4.4 mmol/L (ref 0.5–1.9)

## 2023-08-17 LAB — POCT ACTIVATED CLOTTING TIME: Activated Clotting Time: 210 s

## 2023-08-17 SURGERY — CORONARY/GRAFT ACUTE MI REVASCULARIZATION
Anesthesia: Moderate Sedation

## 2023-08-17 MED ORDER — FENTANYL CITRATE (PF) 100 MCG/2ML IJ SOLN
INTRAMUSCULAR | Status: AC
  Filled 2023-08-17: qty 2

## 2023-08-17 MED ORDER — NITROGLYCERIN 0.4 MG SL SUBL: 0.4000 mg | SUBLINGUAL_TABLET | SUBLINGUAL | Status: DC | PRN

## 2023-08-17 MED ORDER — SODIUM CHLORIDE 0.9% FLUSH
3.0000 mL | Freq: Two times a day (BID) | INTRAVENOUS | Status: DC
  Administered 2023-08-17 – 2023-08-19 (×5): 3 mL via INTRAVENOUS

## 2023-08-17 MED ORDER — MIDAZOLAM HCL 2 MG/2ML IJ SOLN
INTRAMUSCULAR | Status: AC
  Filled 2023-08-17: qty 2

## 2023-08-17 MED ORDER — MIDAZOLAM HCL 2 MG/2ML IJ SOLN
INTRAMUSCULAR | Status: DC | PRN
  Administered 2023-08-17: 1 mg via INTRAVENOUS

## 2023-08-17 MED ORDER — IOHEXOL 300 MG/ML  SOLN
INTRAMUSCULAR | Status: DC | PRN
  Administered 2023-08-17: 150 mL

## 2023-08-17 MED ORDER — ROSUVASTATIN CALCIUM 10 MG PO TABS
40.0000 mg | ORAL_TABLET | Freq: Every day | ORAL | Status: DC
  Administered 2023-08-17 – 2023-08-20 (×4): 40 mg via ORAL
  Filled 2023-08-17 (×4): qty 4

## 2023-08-17 MED ORDER — CHLORHEXIDINE GLUCONATE CLOTH 2 % EX PADS: 6.0000 | MEDICATED_PAD | Freq: Every day | CUTANEOUS | Status: DC

## 2023-08-17 MED ORDER — ACETAMINOPHEN 325 MG PO TABS
650.0000 mg | ORAL_TABLET | ORAL | Status: DC | PRN
Start: 2023-08-17 — End: 2023-08-18

## 2023-08-17 MED ORDER — TIROFIBAN HCL IN NACL 5-0.9 MG/100ML-% IV SOLN
INTRAVENOUS | Status: AC
  Filled 2023-08-17: qty 100

## 2023-08-17 MED ORDER — ONDANSETRON HCL 4 MG/2ML IJ SOLN: 4.0000 mg | Freq: Four times a day (QID) | INTRAMUSCULAR | Status: DC | PRN

## 2023-08-17 MED ORDER — ASPIRIN 81 MG PO TBEC
81.0000 mg | DELAYED_RELEASE_TABLET | Freq: Every day | ORAL | Status: DC
  Administered 2023-08-18 – 2023-08-20 (×3): 81 mg via ORAL
  Filled 2023-08-17 (×3): qty 1

## 2023-08-17 MED ORDER — TIROFIBAN (AGGRASTAT) BOLUS VIA INFUSION
INTRAVENOUS | Status: DC | PRN
  Administered 2023-08-17: 2237.5 ug via INTRAVENOUS

## 2023-08-17 MED ORDER — SODIUM CHLORIDE 0.9% FLUSH: 3.0000 mL | INTRAVENOUS | Status: DC | PRN

## 2023-08-17 MED ORDER — HEPARIN SODIUM (PORCINE) 1000 UNIT/ML IJ SOLN
INTRAMUSCULAR | Status: DC | PRN
  Administered 2023-08-17: 4500 [IU] via INTRAVENOUS
  Administered 2023-08-17: 4000 [IU] via INTRAVENOUS

## 2023-08-17 MED ORDER — IRBESARTAN 150 MG PO TABS
75.0000 mg | ORAL_TABLET | Freq: Every day | ORAL | Status: DC
  Administered 2023-08-17 – 2023-08-20 (×4): 75 mg via ORAL
  Filled 2023-08-17 (×4): qty 1

## 2023-08-17 MED ORDER — HEPARIN (PORCINE) IN NACL 1000-0.9 UT/500ML-% IV SOLN
INTRAVENOUS | Status: DC | PRN
  Administered 2023-08-17: 500 mL

## 2023-08-17 MED ORDER — HEPARIN (PORCINE) IN NACL 1000-0.9 UT/500ML-% IV SOLN
INTRAVENOUS | Status: AC
  Filled 2023-08-17: qty 1000

## 2023-08-17 MED ORDER — METOPROLOL TARTRATE 50 MG PO TABS
50.0000 mg | ORAL_TABLET | Freq: Two times a day (BID) | ORAL | Status: DC
  Administered 2023-08-17 – 2023-08-18 (×3): 50 mg via ORAL
  Filled 2023-08-17 (×3): qty 1

## 2023-08-17 MED ORDER — HEPARIN SODIUM (PORCINE) 1000 UNIT/ML IJ SOLN
INTRAMUSCULAR | Status: AC
  Filled 2023-08-17: qty 10

## 2023-08-17 MED ORDER — ASPIRIN 81 MG PO CHEW
81.0000 mg | CHEWABLE_TABLET | Freq: Every day | ORAL | Status: DC
  Administered 2023-08-17: 81 mg via ORAL
  Filled 2023-08-17: qty 1

## 2023-08-17 MED ORDER — CHLORHEXIDINE GLUCONATE CLOTH 2 % EX PADS
6.0000 | MEDICATED_PAD | Freq: Every evening | CUTANEOUS | Status: DC
  Administered 2023-08-17 – 2023-08-19 (×2): 6 via TOPICAL

## 2023-08-17 MED ORDER — FENTANYL CITRATE (PF) 100 MCG/2ML IJ SOLN
INTRAMUSCULAR | Status: DC | PRN
  Administered 2023-08-17: 25 ug via INTRAVENOUS

## 2023-08-17 MED ORDER — HYDRALAZINE HCL 20 MG/ML IJ SOLN: 10.0000 mg | INTRAMUSCULAR | Status: AC | PRN

## 2023-08-17 MED ORDER — MORPHINE SULFATE (PF) 2 MG/ML IV SOLN
2.0000 mg | INTRAVENOUS | Status: DC | PRN
  Administered 2023-08-17 (×2): 2 mg via INTRAVENOUS
  Filled 2023-08-17 (×2): qty 1

## 2023-08-17 MED ORDER — SODIUM CHLORIDE 0.9 % IV SOLN
250.0000 mL | INTRAVENOUS | Status: AC | PRN
Start: 2023-08-17 — End: 2023-08-18

## 2023-08-17 MED ORDER — LIDOCAINE HCL (PF) 1 % IJ SOLN
INTRAMUSCULAR | Status: DC | PRN
  Administered 2023-08-17: 2 mL

## 2023-08-17 MED ORDER — LABETALOL HCL 5 MG/ML IV SOLN
10.0000 mg | INTRAVENOUS | Status: AC | PRN
Start: 2023-08-17 — End: 2023-08-17

## 2023-08-17 MED ORDER — TIROFIBAN HCL IV 12.5 MG/250 ML
INTRAVENOUS | Status: DC | PRN
  Administered 2023-08-17: .075 ug/kg/min via INTRAVENOUS

## 2023-08-17 MED ORDER — ASPIRIN 81 MG PO CHEW: 324.0000 mg | CHEWABLE_TABLET | ORAL | Status: AC

## 2023-08-17 MED ORDER — HEPARIN BOLUS VIA INFUSION
4000.0000 [IU] | Freq: Once | INTRAVENOUS | Status: AC
  Administered 2023-08-17: 4000 [IU] via INTRAVENOUS
  Filled 2023-08-17: qty 4000

## 2023-08-17 MED ORDER — SODIUM CHLORIDE 0.9 % IV SOLN: INTRAVENOUS | Status: AC

## 2023-08-17 MED ORDER — LIDOCAINE HCL 1 % IJ SOLN
INTRAMUSCULAR | Status: AC
  Filled 2023-08-17: qty 20

## 2023-08-17 MED ORDER — VERAPAMIL HCL 2.5 MG/ML IV SOLN
INTRAVENOUS | Status: AC
  Filled 2023-08-17: qty 2

## 2023-08-17 MED ORDER — HEPARIN (PORCINE) 25000 UT/250ML-% IV SOLN
1450.0000 [IU]/h | INTRAVENOUS | Status: DC
  Administered 2023-08-17: 1150 [IU]/h via INTRAVENOUS
  Administered 2023-08-18: 1300 [IU]/h via INTRAVENOUS
  Administered 2023-08-19: 1450 [IU]/h via INTRAVENOUS
  Filled 2023-08-17 (×3): qty 250

## 2023-08-17 MED ORDER — ASPIRIN 300 MG RE SUPP
300.0000 mg | RECTAL | Status: DC
  Filled 2023-08-17: qty 1

## 2023-08-17 MED ORDER — VERAPAMIL HCL 2.5 MG/ML IV SOLN
INTRAVENOUS | Status: DC | PRN
  Administered 2023-08-17: 2.5 mg via INTRA_ARTERIAL

## 2023-08-17 SURGICAL SUPPLY — 14 items
CATH 5F 110X4 TIG (CATHETERS) IMPLANT
CATH INFINITI 5FR ANG PIGTAIL (CATHETERS) IMPLANT
CATH VISTA GUIDE 6FR XB4 MULPK (CATHETERS) IMPLANT
CATH VISTA GUIDE 6FR XBLD 3.5 (CATHETERS) IMPLANT
DEVICE RAD TR BAND REGULAR (VASCULAR PRODUCTS) IMPLANT
DRAPE BRACHIAL (DRAPES) IMPLANT
GLIDESHEATH SLEND SS 6F .021 (SHEATH) IMPLANT
GUIDEWIRE INQWIRE 1.5J.035X260 (WIRE) IMPLANT
KIT ENCORE 26 ADVANTAGE (KITS) IMPLANT
KIT SYRINGE INJ CVI SPIKEX1 (MISCELLANEOUS) IMPLANT
PACK CARDIAC CATH (CUSTOM PROCEDURE TRAY) ×1 IMPLANT
SET ATX-X65L (MISCELLANEOUS) IMPLANT
STATION PROTECTION PRESSURIZED (MISCELLANEOUS) IMPLANT
WIRE ASAHI PROWATER 180CM (WIRE) IMPLANT

## 2023-08-17 NOTE — Assessment & Plan Note (Signed)
 Unknown etiology. Will try the patient on a diet and see if he is still having problems. Monitor. Chemistry ordered to evaluate for metabolic derangements, signs of hypovolemia, or electrolyte abnormalities. Antiemetics available.

## 2023-08-17 NOTE — Assessment & Plan Note (Signed)
 Code STEMI called. Patient taken to cath lab. No culprit lesion found. He has been returned to the ICU and placed on aggrastat  for three hours. Echocardiogram is pending. 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 Possible repeat LHC in 2 days.

## 2023-08-17 NOTE — H&P (Signed)
 History and Physical    Patient: Cody Herrera FMW:[id number] DOB: 1945/09/03 DOA: 08/17/2023 DOS: the patient was seen and examined on 08/17/2023 PCP: [name], MD  Patient coming from: Home  Chief Complaint:  Chief Complaint  Patient presents with   Code STEMI   HPI: [name] is a 78 y.o. male with medical history significant for hypertension, hyperlipidemia, and CAD with history of stent x 1. The patient states that he was in his usual state of health until midday 08/16/2023 when he began having nausea/vomiting and diarrhea. Then around 0400 he began having chest pain as well. He stated that the chest pain was 5/10. EMS was called. They performed an EKG which demonstrated down going T's inferiorly, and elevated ST segments laterally and antero-septally. Code STEMI was call en-route. The patient received ASA 324, NTG x 2 and fentanyl  en route. Upon arrival he received 4000 U of heparin  and went directly to the cath lab. Dr. Anner performed LHC which demonstrated no obvious culprit lesion. There was TIMI-3 flow down all vessels except maybe the apical LAD. LV gram demonstrated possible Takotsubo. CP was 2-3/10 with significant ST elevations, suggesting Takotsubo.   The patient has been returned to the ICU. He will receive Aggrastat  for 3 hours. Echocardiogram has been ordered. Cardiac enzymes will be followed. He has been started on lopressor  50 mg bid, irbesartan  75 mg daily and rosuvastatin  40 mg daily. He will also receive pain control. Possible repeat LHC in a couple of days per cardiology.  Review of Systems: As mentioned in the history of present illness. All other systems reviewed and are negative. Past Medical History:  Diagnosis Date   Actinic keratosis    Dysplastic nevus 07/05/2020   left mid back, severe atypia, close to margin, clear 10/01/22   Hypercholesteremia    Hyperlipidemia    Hypertension    Past Surgical History:  Procedure  Laterality Date   ABDOMINAL SURGERY     VISCERAL ANGIOGRAPHY Bilateral 12/11/2018   Procedure: VISCERAL ANGIOGRAPHY;  Surgeon: Dedra Agent, MD;  Location: [facility] INVASIVE CV LAB;  Service: Cardiovascular;  Laterality: Bilateral;   Social History:  reports that he has never smoked. He has never used smokeless tobacco. He reports current alcohol use. He reports that he does not use drugs.  No Known Allergies  Family History  Problem Relation Age of Onset   Heart attack Mother    Heart attack Father     Prior to Admission medications   Medication Sig Start Date End Date Taking? Authorizing Provider  atenolol  (TENORMIN ) 100 MG tablet Take 100 mg by mouth daily.    [provider]  BESIVANCE 0.6 % SUSP Place 1 drop into the left eye 3 (three) times daily. Patient not taking: Reported on 07/05/2020 11/12/18   [provider]  DUREZOL  0.05 % EMUL Place 1 drop into the left eye 3 (three) times daily. Patient not taking: Reported on 07/05/2020 11/12/18   [provider]  Flaxseed, Linseed, (FLAX SEEDS PO) Take 1 capsule by mouth daily. Patient not taking: Reported on 07/05/2020    [provider]  lisinopril  (ZESTRIL ) 20 MG tablet Take 20 mg by mouth daily. Patient not taking: Reported on 07/05/2020    [provider]  metoprolol  tartrate (LOPRESSOR ) 50 MG tablet Take by mouth. 05/28/20   [provider]  Multiple Vitamin (MULTIVITAMIN WITH MINERALS) TABS tablet Take 1 tablet by mouth daily. Patient not taking: Reported on 07/05/2020    [provider]  PROLENSA 0.07 % SOLN Place 1 drop into the left eye at bedtime. Patient not taking: Reported on 07/05/2020 11/12/18   [provider]  rosuvastatin  (CRESTOR ) 40 MG tablet Take 40 mg by mouth daily. 06/12/20   [provider]  simvastatin  (ZOCOR ) 40 MG tablet Take 40 mg by mouth at bedtime. Patient not taking: Reported on 07/05/2020    [provider]    Physical  Exam: Vitals:   08/17/23 0532 08/17/23 0534 08/17/23 0553 08/17/23 0700  BP: (!) 152/86  (!) 152/86 (!) 152/91  Pulse: 80  80 95  Resp: 19  19 18   Temp: 97.8 F (36.6 C)  97.8 F (36.6 C)   TempSrc: Oral  Oral   SpO2: 98%  98% 96%  Weight:  89.5 kg    Height:  5' 7 (1.702 m)     Exam:  Constitutional:  The patient is awake, alert, and oriented x 3. No acute distress. Eyes:  pupils and irises appear normal Normal lids and conjunctivae ENMT:  grossly normal hearing  Lips appear normal external ears, nose appear normal Oropharynx: mucosa, tongue,posterior pharynx appear normal Neck:  neck appears normal, no masses, normal ROM, supple no thyromegaly Respiratory:  No increased work of breathing. No wheezes, rales, or rhonchi No tactile fremitus Cardiovascular:  Regular rate and rhythm No murmurs, ectopy, or gallups. No lateral PMI. No thrills. Abdomen:  Abdomen is soft, non-tender, non-distended No hernias, masses, or organomegaly Normoactive bowel sounds.  Musculoskeletal:  No cyanosis, clubbing, or edema Skin:  No rashes, lesions, ulcers palpation of skin: no induration or nodules Neurologic:  CN 2-12 intact Sensation all 4 extremities intact Psychiatric:  Mental status Mood, affect appropriate Orientation to person, place, time  judgment and insight appear intact  Data Reviewed:  LHC results  Assessment and Plan: Chest pain After having nausea/vomiting diarrhea for half the day on 08/16/2023 he awoke at 0400 with 5/10 chest pain. EMS was called. EKG demonstrated significant ST segment elevations in lateral and anteroseptal leads and ST segment depressions inferiorly. Code STEMI was enacted from the field.Pt received fentanyl , ASA 324, and  nitroglycerin  x 2 from EMS. Pt underwent LHC with no culprit lesion found. He is now in the ICU on aggrastat , lopressor  50 mg bid, and irbesartan  75 mg daily. Echocardiogram is pending. He will possibly undergo LHC again in  a couple of days. 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  CAD S/P percutaneous coronary angioplasty Noted. As of 01/29/2023 the patient was taking ASA 81 mg daily, Atenolol  100 mg daily, zestril  40 mg daily, Rosuvastatin  40 mg daily. However the patient now states that he does not take ASA 81 mg daily because it makes me bleed.  Hyperlipidemia Crestor  40 mg daily continued.  Essential hypertension Cardiology has placed the patient on irbesartan  75 mg daily and metoprolol  50 mg bid. As of 01/29/2023 the patient was taking Atenolol  100 mg daily and zestril  40 mg daily.  Acute ST elevation myocardial infarction (STEMI) of anterior wall (HCC) Code STEMI called. Patient taken to cath lab. No culprit lesion found. He has been returned to the ICU and placed on aggrastat  for three hours. Echocardiogram is pending. 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 Possible repeat LHC in 2 days.  Acute coronary syndrome (HCC) After having nausea/vomiting diarrhea for half the day on 08/16/2023 he awoke at 0400 with 5/10 chest pain. EMS was called. EKG demonstrated significant ST segment elevations in lateral  and anteroseptal leads and ST segment depressions inferiorly. Code STEMI was enacted from the field.Pt received fentanyl , ASA 324, and  nitroglycerin  x 2 from EMS. Pt underwent LHC with no culprit lesion found. He is now in the ICU on aggrastat , lopressor  50 mg bid, and irbesartan  75 mg daily. Echocardiogram is pending. He will possibly undergo LHC again in a couple of days. 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  Nausea vomiting and diarrhea Unknown etiology. Will try the patient on a diet and see if he is still having problems. Monitor. Chemistry ordered to evaluate for metabolic derangements, signs of hypovolemia, or electrolyte abnormalities. Antiemetics available.      Advance Care Planning:   Code Status: Full Code   Consults:  Cardiology  Family Communication: None available  Severity of Illness: The appropriate patient status for this patient is INPATIENT. Inpatient status is judged to be reasonable and necessary in order to provide the required intensity of service to ensure the patient's safety. The patient's presenting symptoms, physical exam findings, and initial radiographic and laboratory data in the context of their chronic comorbidities is felt to place them at high risk for further clinical deterioration. Furthermore, it is not anticipated that the patient will be medically stable for discharge from the hospital within 2 midnights of admission.   * I certify that at the point of admission it is my clinical judgment that the patient will require inpatient hospital care spanning beyond 2 midnights from the point of admission due to high intensity of service, high risk for further deterioration and high frequency of surveillance required.*  Author: Cristina Ceniceros, DO 08/17/2023 7:40 AM  For on call review [email or web address].

## 2023-08-17 NOTE — Consult Note (Signed)
 Cardiology Consultation   Patient ID: Cody Herrera MRN: [id number]; DOB: 09-27-1945  Admit date: 08/17/2023 Date of Consult: 08/17/2023  PCP:  [name], MD   [facility] Providers Cardiologist:  None        Patient Profile: Cody Herrera is a 78 y.o. male with a hx of CAD-PCI, HTN HLD who is being seen 08/17/2023 for the evaluation of anterior STEMI at the request of Dr.  Viviann.  History of Present Illness: Cody Herrera stated that he was doing relatively well until midday on 08/16/2023 when he ate something that was unusual and had significant nausea and vomiting and diarrhea lasting most of the day.  He denied any chest pain at that time.  He went to bed and awoke at roughly 4 AM with significant chest pain nausea and vomiting.  EMS was called and upon arrival he is found to have profound anterior ST elevations over 6 mm in precordial leads and 3 to 4 mm in high lateral leads.  Despite this significant amount of EKG changes he was only having modest 5/10 chest pain.  Upon arrival to the ER he was evaluated by myself and Dr. Viviann.  Was having 3/10 pain after 50 mcg of fentanyl , 2 nitroglycerin  and 324 mg aspirin  on transport.  In the ER labs were drawn and he was given 4000 units IV heparin .  Upon arrival to the Cath Lab team he was brought emergently to cardiac catheterization lab.  He did 90 rapid irregular beats palpitations.  No syncope or near syncope.  No TIA or amaurosis fugax.  No significant dyspnea.  No fevers chills.  He says he does not take aspirin -it makes him bleed  Past Medical History:  Diagnosis Date   Actinic keratosis    Dysplastic nevus 07/05/2020   left mid back, severe atypia, close to margin, clear 10/01/22   Hypercholesteremia    Hyperlipidemia    Hypertension     Past Surgical History:  Procedure Laterality Date   ABDOMINAL SURGERY     VISCERAL ANGIOGRAPHY Bilateral 12/11/2018   Procedure: VISCERAL  ANGIOGRAPHY;  Surgeon: Dedra Agent, MD;  Location: [facility] INVASIVE CV LAB;  Service: Cardiovascular;  Laterality: Bilateral;     He has multiple medications listed but for accuracy he was taken with atenolol  100 mg daily, lisinopril  40 mg daily and rosuvastatin  40 mg daily  Scheduled Meds:  aspirin   81 mg Oral Daily   irbesartan   75 mg Oral Daily   metoprolol  tartrate  50 mg Oral BID   rosuvastatin   40 mg Oral Daily   sodium chloride  flush  3 mL Intravenous Q12H   Continuous Infusions:  sodium chloride      sodium chloride      tirofiban  0.075 mcg/kg/min (08/17/23 0640)   PRN Meds: sodium chloride , acetaminophen , Heparin  (Porcine) in NaCl, hydrALAZINE , labetalol , morphine  injection, ondansetron  (ZOFRAN ) IV, sodium chloride  flush, tirofiban   Allergies:   No Known Allergies  Social History:   Social History   Socioeconomic History   Marital status: Single    Spouse name: Not on file   Number of children: Not on file   Years of education: Not on file   Highest education level: Not on file  Occupational History   Not on file  Tobacco Use   Smoking status: Never   Smokeless tobacco: Never  Substance and Sexual Activity   Alcohol use: Yes    Comment: ocassinally   Drug use: Never   Sexual activity: Not on file  Other Topics Concern   Not on file  Social History Narrative   Not on file   Social Drivers of Health   Financial Resource Strain: Not on file  Food Insecurity: Not on file  Transportation Needs: Not on file  Physical Activity: Not on file  Stress: Not on file  Social Connections: Not on file  Intimate Partner Violence: Not on file    Family History:    Family History  Problem Relation Age of Onset   Heart attack Mother    Heart attack Father      ROS:  Please see the history of present illness.  Nausea vomiting diarrhea.  He also has baseline tremor All other ROS reviewed and negative.     Physical Exam/Data: Vitals:   08/17/23 0532 08/17/23  0534 08/17/23 0553  BP: (!) 152/86  (!) 152/86  Pulse: 80  80  Resp: 19  19  Temp: 97.8 F (36.6 C)  97.8 F (36.6 C)  TempSrc: Oral  Oral  SpO2: 98%  98%  Weight:  89.5 kg   Height:  5' 7 (1.702 m)    No intake or output data in the 24 hours ending 08/17/23 0659    08/17/2023    5:34 AM 12/11/2018    3:45 AM 12/11/2018   12:17 AM  Last 3 Weights  Weight (lbs) 197 lb 6.4 oz 188 lb 0.8 oz 190 lb  Weight (kg) 89.54 kg 85.3 kg 86.183 kg     Body mass index is 30.92 kg/m.  General:  Well nourished, well developed, in mild acute distress: Nontoxic.  Seems relatively comfortable given the extent of ST elevations HEENT: normal Neck: no JVD Vascular: No carotid bruits; Distal pulses 2+ bilaterally Cardiac:  normal S1, S2; RRR; soft 1/6 SEM but otherwise no murmur rubs or gallops Lungs:  clear to auscultation bilaterally, no wheezing, rhonchi or rales  Abd: soft, nontender, no hepatomegaly  Ext: no edema Musculoskeletal:  No deformities, BUE and BLE strength normal and equal Skin: warm and dry  Neuro:  CNs 2-12 intact, no focal abnormalities noted; has a mild tremor Psych:  Normal affect   EKG:  The EKG was personally reviewed and demonstrates: Profound ST elevations in precordial leads as well as I and aVL with depressions in inferior leads, repeat EKG here at [facility] showed less prominent inferior ST elevations Telemetry:  Telemetry was personally reviewed and demonstrates: Rhythm  Relevant CV Studies: 03/23/2009-cardiac cath with 90% RCA treated with a 4 oh by 23 Xience DES.  He says he also has 2 other stents but I do not see reports.  [facility]) 03/25/2011-echo: Normal LVEF.  No RWMA.  Mild AI with trivial MR, PRN TR.  Otherwise normal valves.  Laboratory Data: High Sensitivity Troponin:  No results for input(s): TROPONINIHS in the last 720 hours.   ChemistryNo results for input(s): NA, K, CL, CO2, GLUCOSE, BUN, CREATININE, CALCIUM , MG, GFRNONAA, GFRAA,  ANIONGAP in the last 168 hours.  No results for input(s): PROT, ALBUMIN, AST, ALT, ALKPHOS, BILITOT in the last 168 hours. Lipids No results for input(s): CHOL, TRIG, HDL, LABVLDL, LDLCALC, CHOLHDL in the last 168 hours.  HematologyNo results for input(s): WBC, RBC, HGB, HCT, MCV, MCH, MCHC, RDW, PLT in the last 168 hours. Thyroid No results for input(s): TSH, FREET4 in the last 168 hours.  BNPNo results for input(s): BNP, PROBNP in the last 168 hours.  DDimer No results for input(s): DDIMER in the last 168 hours.  Radiology/Studies:  No results found.   Assessment and Plan: Presenting with what appears to be large anterior STEMI-taken to the Cath Lab where no obvious culprit lesion was found.  TIMI-3 flow down all vessels except maybe the apical LAD.  LV gram showed possible Takotsubo.  2-3/10 chest pain despite significantly elevations but no obvious culprit lesion suspect possible Takotsubo. Given the possibility of plaque rupture with downstream thromboembolism, will treat with Aggrastat  for 3 hours. Check 2D echo and review images with Dr. Darron Follow cardiac enzymes Needed sure what medications he is taking, will start pressure 50 mg twice daily, and irbesartan  75 mg daily. Rosuvastatin  40 mg daily As needed narcotics for pain relief   Risk Assessment/Risk Scores:    TIMI Risk Score for ST  Elevation MI:   The patient's TIMI risk score is 4, which indicates a 7.3% risk of all cause mortality at 30 days.          For questions or updates, please contact [facility] Please consult [email or web address] for contact info under    Signed, Alm Clay, MD  08/17/2023 6:59 AM

## 2023-08-17 NOTE — ED Notes (Addendum)
 ED Provider at bedside.

## 2023-08-17 NOTE — ED Notes (Signed)
Cards MD to bedside

## 2023-08-17 NOTE — Progress Notes (Signed)
 PHARMACY - ANTICOAGULATION CONSULT NOTE  Pharmacy Consult for Heparin  Infusion Indication: chest pain/ACS  No Known Allergies  Patient Measurements: Height: 5' 7 (170.2 cm) Weight: 89.5 kg (197 lb 6.4 oz) IBW/kg (Calculated) : 66.1 HEPARIN  DW (KG): 84.7  Vital Signs: Temp: 98.7 F (37.1 C) (07/07 0800) Temp Source: Oral (07/07 0800) BP: 160/103 (07/07 1100) Pulse Rate: 100 (07/07 1100)  Labs: Recent Labs    08/17/23 0824 08/17/23 1024  HGB 15.0  --   HCT 45.9  --   PLT 203  --   CREATININE 0.92  --   TROPONINIHS 13,252* 23,394*    Estimated Creatinine Clearance: 72.9 mL/min (by C-G formula based on SCr of 0.92 mg/dL).   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 EKG demostrates significant ST elevations, patient was taken to the Cath lab [facility] and no culprit lesions were found. Plan is to repeat LHC in a few days. Pharmacy consulted for Heparin  dosing. Patient did receive some heparin  in the Cath lab around 06:00.  Goal of Therapy:  Heparin  level 0.3-0.7 units/ml Monitor platelets by anticoagulation protocol: Yes   Plan:  Give 4000 units bolus x 1 Start heparin  infusion at 1150 units/hr Check anti-Xa level in 8 hours and daily while on heparin  Continue to monitor H&H and platelets  Olam Fritter, PharmD, BCPS 08/17/2023 6:03 PM

## 2023-08-17 NOTE — ED Notes (Signed)
Placed on Zoll.  

## 2023-08-17 NOTE — Assessment & Plan Note (Signed)
 After having nausea/vomiting diarrhea for half the day on 08/16/2023 he awoke at 0400 with 5/10 chest pain. EMS was called. EKG demonstrated significant ST segment elevations in lateral and anteroseptal leads and ST segment depressions inferiorly. Code STEMI was enacted from the field.Pt received fentanyl , ASA 324, and  nitroglycerin  x 2 from EMS. Pt underwent LHC with no culprit lesion found. He is now in the ICU on aggrastat , lopressor  50 mg bid, and irbesartan  75 mg daily. Echocardiogram is pending. He will possibly undergo LHC again in a couple of days. 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

## 2023-08-17 NOTE — ED Notes (Signed)
 Vital signs stable.

## 2023-08-17 NOTE — Assessment & Plan Note (Signed)
 Cardiology has placed the patient on irbesartan  75 mg daily and metoprolol  50 mg bid. As of 01/29/2023 the patient was taking Atenolol  100 mg daily and zestril  40 mg daily.

## 2023-08-17 NOTE — Assessment & Plan Note (Signed)
 Noted. As of 01/29/2023 the patient was taking ASA 81 mg daily, Atenolol  100 mg daily, zestril  40 mg daily, Rosuvastatin  40 mg daily. However the patient now states that he does not take ASA 81 mg daily because it makes me bleed.

## 2023-08-17 NOTE — Assessment & Plan Note (Signed)
 Crestor  40 mg daily continued.

## 2023-08-17 NOTE — Progress Notes (Signed)
*  PRELIMINARY RESULTS* Echocardiogram 2D Echocardiogram has been performed.  Floydene Harder 08/17/2023, 10:55 AM

## 2023-08-17 NOTE — ED Provider Notes (Signed)
 [facility] Provider Note    Event Date/Time   First MD Initiated Contact with Patient 08/17/23 [phone]     (approximate)   History   Chief Complaint: No chief complaint on file.   HPI  Cody Herrera is a 78 y.o. male with a history of hypertension hyperlipidemia, CAD status post 1 prior coronary stent who developed chest pain at about 4:00 AM this morning, center of the chest, nonradiating, severe.  EMS arrived gave the patient 2 nitroglycerin , 324 of aspirin , 50 mics of fentanyl  with improvement of pain down to about a 3/10.  Transmitted EKG and STEMI was activated during transport.        Past Medical History:  Diagnosis Date   Actinic keratosis    Dysplastic nevus 07/05/2020   left mid back, severe atypia, close to margin, clear 10/01/22   Hypercholesteremia    Hyperlipidemia    Hypertension     Current Outpatient Rx   Order #: [id number] Class: Historical Med   Order #: [id number] Class: Historical Med   Order #: [id number] Class: Historical Med   Order #: [id number] Class: Historical Med   Order #: [id number] Class: Historical Med   Order #: [id number] Class: Historical Med   Order #: [id number] Class: Historical Med   Order #: [id number] Class: Historical Med   Order #: [id number] Class: Historical Med   Order #: [id number] Class: Historical Med    Past Surgical History:  Procedure Laterality Date   ABDOMINAL SURGERY     VISCERAL ANGIOGRAPHY Bilateral 12/11/2018   Procedure: VISCERAL ANGIOGRAPHY;  Surgeon: Dedra Agent, MD;  Location: [facility] INVASIVE CV LAB;  Service: Cardiovascular;  Laterality: Bilateral;    Physical Exam   Triage Vital Signs: ED Triage Vitals  Encounter Vitals Group     BP      Girls Systolic BP Percentile      Girls Diastolic BP Percentile      Boys Systolic BP Percentile      Boys Diastolic BP Percentile      Pulse      Resp      Temp      Temp src      SpO2      Weight      Height      Head Circumference       Peak Flow      Pain Score      Pain Loc      Pain Education      Exclude from Growth Chart     Most recent vital signs: There were no vitals filed for this visit.  General: Awake, no distress.  CV:  Good peripheral perfusion.  Regular rate rhythm.  Normal distal pulses x 4 Resp:  Normal effort.  Clear to auscultation bilaterally Abd:  No distention.  Soft nontender Other:  No lower extremity edema   ED Results / Procedures / Treatments   Labs (all labs ordered are listed, but only abnormal results are displayed) Labs Reviewed - No data to display   EKG Interpreted by me Sinus rhythm rate of 80.  Normal axis, first-degree AV block.  ST elevation in 1 aVL V2 V3 with ST depression in 3 and aVF consistent with STEMI.   RADIOLOGY    PROCEDURES:  .Critical Care  Performed by: Viviann Pastor, MD Authorized by: Viviann Pastor, MD   Critical care provider statement:    Critical care time (minutes):  15   Critical care time was exclusive of:  Separately billable procedures and treating other  patients   Critical care was necessary to treat or prevent imminent or life-threatening deterioration of the following conditions:  Sepsis and respiratory failure   Critical care was time spent personally by me on the following activities:  Development of treatment plan with patient or surrogate, discussions with consultants, evaluation of patient's response to treatment, examination of patient, obtaining history from patient or surrogate, ordering and performing treatments and interventions, ordering and review of laboratory studies, ordering and review of radiographic studies, pulse oximetry, re-evaluation of patient's condition and review of old charts    MEDICATIONS ORDERED IN ED: Medications - No data to display   IMPRESSION / MDM / ASSESSMENT AND PLAN / ED COURSE  I reviewed the triage vital signs and the nursing notes.   Patient's presentation is most consistent with  acute presentation with potential threat to life or bodily function.  Patient presents with chest pain starting 90 minutes ago, EKG very suspicious for STEMI.  Cardiology Dr. Anner at bedside on arrival, will plan to take patient emergently for cath and coronary vascularization.  Aspirin  given by EMS.  Heparin  4000 units IV bolus given in ED.  Vital signs stable, lungs clear       FINAL CLINICAL IMPRESSION(S) / ED DIAGNOSES   Final diagnoses:  ST elevation myocardial infarction (STEMI), unspecified artery (HCC)     Rx / DC Orders   ED Discharge Orders     None        Note:  This document was prepared using Dragon voice recognition software and may include unintentional dictation errors.   Viviann Pastor, MD 08/17/23 [phone]

## 2023-08-17 NOTE — Plan of Care (Signed)
 Patient with hematoma on right Radial site resolved. Transparent dressing placed on site. Patient without complaints of pain.  Problem: Clinical Measurements: Goal: Cardiovascular complication will be avoided Outcome: Progressing   Problem: Pain Managment: Goal: General experience of comfort will improve and/or be controlled Outcome: Progressing

## 2023-08-17 NOTE — Progress Notes (Signed)
   08/17/23 0500  Spiritual Encounters  Type of Visit Initial  Referral source Code page  Reason for visit Code  OnCall Visit Yes   Chaplain responded to Code STEMI. Patient receiving attention from Medical team. No family present. Chaplain services available.

## 2023-08-17 NOTE — ED Notes (Signed)
 Transport to cath lab on Zoll monitor with [employer] and cards M.D.

## 2023-08-18 ENCOUNTER — Other Ambulatory Visit (HOSPITAL_COMMUNITY): Payer: Self-pay

## 2023-08-18 ENCOUNTER — Telehealth (HOSPITAL_COMMUNITY): Payer: Self-pay | Admitting: Pharmacy Technician

## 2023-08-18 DIAGNOSIS — I2109 ST elevation (STEMI) myocardial infarction involving other coronary artery of anterior wall: Secondary | ICD-10-CM | POA: Diagnosis not present

## 2023-08-18 DIAGNOSIS — I5021 Acute systolic (congestive) heart failure: Secondary | ICD-10-CM

## 2023-08-18 LAB — URINALYSIS, W/ REFLEX TO CULTURE (INFECTION SUSPECTED)
Bilirubin Urine: NEGATIVE
Glucose, UA: NEGATIVE mg/dL
Ketones, ur: NEGATIVE mg/dL
Leukocytes,Ua: NEGATIVE
Nitrite: NEGATIVE
Protein, ur: NEGATIVE mg/dL
Specific Gravity, Urine: 1.011 (ref 1.005–1.030)
pH: 6 (ref 5.0–8.0)

## 2023-08-18 LAB — MAGNESIUM: Magnesium: 1.7 mg/dL (ref 1.7–2.4)

## 2023-08-18 LAB — BASIC METABOLIC PANEL WITH GFR
Anion gap: 10 (ref 5–15)
BUN: 17 mg/dL (ref 8–23)
CO2: 25 mmol/L (ref 22–32)
Calcium: 8.3 mg/dL — ABNORMAL LOW (ref 8.9–10.3)
Chloride: 102 mmol/L (ref 98–111)
Creatinine, Ser: 0.87 mg/dL (ref 0.61–1.24)
GFR, Estimated: >60 mL/min (ref >60)
Glucose, Bld: 134 mg/dL — ABNORMAL HIGH (ref 70–99)
Potassium: 3.5 mmol/L (ref 3.5–5.1)
Sodium: 137 mmol/L (ref 135–145)

## 2023-08-18 LAB — HEPARIN LEVEL (UNFRACTIONATED)
Heparin Unfractionated: 0.22 [IU]/mL — ABNORMAL LOW (ref 0.30–0.70)
Heparin Unfractionated: 0.26 [IU]/mL — ABNORMAL LOW (ref 0.30–0.70)
Heparin Unfractionated: 0.38 [IU]/mL (ref 0.30–0.70)

## 2023-08-18 LAB — CBC
HCT: 41.5 % (ref 39.0–52.0)
Hemoglobin: 13.8 g/dL (ref 13.0–17.0)
MCH: 30.4 pg (ref 26.0–34.0)
MCHC: 33.3 g/dL (ref 30.0–36.0)
MCV: 91.4 fL (ref 80.0–100.0)
Platelets: 155 K/uL (ref 150–400)
RBC: 4.54 MIL/uL (ref 4.22–5.81)
RDW: 13.2 % (ref 11.5–15.5)
WBC: 10.3 K/uL (ref 4.0–10.5)
nRBC: 0 % (ref 0.0–0.2)

## 2023-08-18 LAB — VITAMIN D 25 HYDROXY (VIT D DEFICIENCY, FRACTURES): Vit D, 25-Hydroxy: 39.04 ng/mL (ref 30–100)

## 2023-08-18 LAB — LACTIC ACID, PLASMA: Lactic Acid, Venous: 2.5 mmol/L (ref 0.5–1.9)

## 2023-08-18 LAB — VITAMIN B12: Vitamin B-12: 208 pg/mL (ref 180–914)

## 2023-08-18 LAB — PHOSPHORUS: Phosphorus: 2.5 mg/dL (ref 2.5–4.6)

## 2023-08-18 MED ORDER — ORAL CARE MOUTH RINSE: 15.0000 mL | OROMUCOSAL | Status: DC | PRN

## 2023-08-18 MED ORDER — METOPROLOL SUCCINATE ER 25 MG PO TB24
12.5000 mg | ORAL_TABLET | Freq: Every day | ORAL | Status: DC
  Administered 2023-08-19: 12.5 mg via ORAL
  Filled 2023-08-18: qty 1

## 2023-08-18 MED ORDER — PANTOPRAZOLE SODIUM 40 MG PO TBEC
40.0000 mg | DELAYED_RELEASE_TABLET | Freq: Two times a day (BID) | ORAL | Status: DC
  Administered 2023-08-18 – 2023-08-19 (×3): 40 mg via ORAL
  Filled 2023-08-18 (×4): qty 1

## 2023-08-18 MED ORDER — ACETAMINOPHEN 325 MG PO TABS
650.0000 mg | ORAL_TABLET | Freq: Four times a day (QID) | ORAL | Status: DC | PRN
  Administered 2023-08-18: 650 mg via ORAL
  Filled 2023-08-18: qty 2

## 2023-08-18 MED ORDER — HEPARIN BOLUS VIA INFUSION
1300.0000 [IU] | Freq: Once | INTRAVENOUS | Status: AC
  Administered 2023-08-18: 1300 [IU] via INTRAVENOUS
  Filled 2023-08-18: qty 1300

## 2023-08-18 MED ORDER — HEPARIN BOLUS VIA INFUSION
1000.0000 [IU] | Freq: Once | INTRAVENOUS | Status: AC
  Administered 2023-08-18: 1000 [IU] via INTRAVENOUS
  Filled 2023-08-18: qty 1000

## 2023-08-18 MED ORDER — POTASSIUM CHLORIDE CRYS ER 20 MEQ PO TBCR
40.0000 meq | EXTENDED_RELEASE_TABLET | Freq: Once | ORAL | Status: AC
  Administered 2023-08-18: 40 meq via ORAL
  Filled 2023-08-18: qty 2

## 2023-08-18 NOTE — Progress Notes (Signed)
 Progress Note  Patient Name: Cody Herrera Date of Encounter: 08/18/2023 [facility] Cardiologist:New  Interval Summary    Patient denies chest pain. Cath site is stable, improving. Vitals look good. Lactic acid trending down.   Vital Signs Vitals:   08/18/23 0900 08/18/23 0930 08/18/23 1000 08/18/23 1100  BP: (!) 112/101  109/60 108/76  Pulse: 86  82 90  Resp: (!) 26  (!) 24 (!) 21  Temp:      TempSrc:      SpO2: 93% 96% 93% 91%  Weight:      Height:        Intake/Output Summary (Last 24 hours) at 08/18/2023 1346 Last data filed at 08/18/2023 1136 Gross per 24 hour  Intake 593.17 ml  Output --  Net 593.17 ml      08/17/2023    5:34 AM 12/11/2018    3:45 AM 12/11/2018   12:17 AM  Last 3 Weights  Weight (lbs) 197 lb 6.4 oz 188 lb 0.8 oz 190 lb  Weight (kg) 89.54 kg 85.3 kg 86.183 kg      Telemetry/ECG  NSR 70s, PVCs - Personally Reviewed  Physical Exam  GEN: No acute distress.   Neck: No JVD Cardiac: RRR, no murmurs, rubs, or gallops.  Respiratory: Clear to auscultation bilaterally. GI: Soft, nontender, non-distended  MS: No edema  Cardiac Studies:  Echo 08/17/23 1. Basal function preserved septal, apical distal anterior wall and  inferior apical akinesis Findings consistent with LAD infarct vs Takatsubo  DCM. Left ventricular ejection fraction, by estimation, is 30 to 35%. The  left ventricle has moderately  decreased function. The left ventricle has no regional wall motion  abnormalities. The left ventricular internal cavity size was severely  dilated. There is mild asymmetric left ventricular hypertrophy of the  basal and septal segments. Left ventricular  diastolic parameters were normal.   2. Right ventricular systolic function is normal. The right ventricular  size is normal.   3. The mitral valve is normal in structure. No evidence of mitral valve  regurgitation. No evidence of mitral stenosis.   4. The aortic valve is normal in  structure. Aortic valve regurgitation is  mild. No aortic stenosis is present.   5. Aortic dilatation noted. There is severe dilatation of the aortic  root, measuring 48 mm. There is dilatation of the ascending aorta,  measuring 48 mm.   6. The inferior vena cava is normal in size with greater than 50%  respiratory variability, suggesting right atrial pressure of 3 mmHg.    Cardiac cath 08/17/23   Ost LAD to Prox LAD lesion is 20% stenosed with 40% stenosed side branch in 1st Diag.   Previously placed Prox LAD to Mid LAD stent is 5% stenosed.   Previously placed Prox RCA DES stent is  widely patent.   The left ventricular ejection fraction is 35-45% by visual estimate.  Apical ballooning/hypokinesis, with basal hypokinesis   There is no aortic valve stenosis.   Dominance: Right  Widely patent RCA and proximal LAD stent. No obvious culprit lesion besides potentially poor distal flow in the LAD. Nothing to explain the extensive ST elevations with minimal pain. ->  Cannot exclude potential hazy segment in the very ostial LAD but not occlusive. LV gram shows clear evidence of apical ballooning suggestive of Takotsubo with relative normal LVEDP        Check 2D echo-consider proximal relook angiogram in 1 to 2 days with intravascular imaging Follow cardiac enzymes Needed  sure what medications he is taking, will start Lopressor  50 mg twice daily, and irbesartan  75 mg daily. Rosuvastatin  40 mg daily As needed narcotics for pain relief   Will run Aggrastat  for 3 hours and start aspirin  81 mg daily; would not initiate oral antiplatelet until relook cath.    Assessment & Plan   Anterior STEMI - cath showed no obvious culprit lesion, patent RCA and pLCA stent, ostial LAD and ostial 1st diag was hazy - post-cath hematoma resolved. Hgb stable - echo showed LVEF 30-35%, anterior wall inferior apical akinesis, severe dilation of aortic root - restarted on IV heparin  to complete 48 hours, can stop  tomorrow PM - continue ASA 81mg  daily, metoprolol  50mg  BID and crestor  40mg  daily - interventionalist recommended re-look cath to interrogate ostial LAD area, however patient is unsure if he wants to pursue this as he is anxious to get home.   HFrEF - echo this admission showed reduced LVEF 30-35% - MPI in 2019 showed EF 64% - not felt to be stress induced, suspected ICM - plan as above - continue irbesartan  and metoprolol  - continue GDMT as able - repeat echo in 2-3 months  Lactic acidosis - no signs of shock - LA 4.4>2.5 - can re-check tomorrow - vitals OK, BP soft  HTN - blood pressures improved on metoprolol  50mg  BID and Irbesartan  75mg  daily  HLD - LDL 54 - Crestor  40mg  daily     For questions or updates, please contact [facility] Please consult [email or web address] for contact info under       Signed, Hisayo Delossantos VEAR Fishman, PA-C

## 2023-08-18 NOTE — Progress Notes (Signed)
 PHARMACY - ANTICOAGULATION CONSULT NOTE  Pharmacy Consult for Heparin  Infusion Indication: chest pain/ACS  Patient Measurements: Height: 5' 7 (170.2 cm) Weight: 89.5 kg (197 lb 6.4 oz) IBW/kg (Calculated) : 66.1 HEPARIN  DW (KG): 84.7  Labs: Recent Labs    08/17/23 0824 08/17/23 1024 08/18/23 0305 08/18/23 0618 08/18/23 1211  HGB 15.0  --  13.8  --   --   HCT 45.9  --  41.5  --   --   PLT 203  --  155  --   --   HEPARINUNFRC  --   --  0.22*  --  0.26*  CREATININE 0.92  --   --  0.87  --   TROPONINIHS 13,252* 23,394*  --   --   --     Estimated Creatinine Clearance: 77.1 mL/min (by C-G formula based on SCr of 0.87 mg/dL).   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 EKG demostrates significant ST elevations, patient was taken to the Cath lab for [facility] and no culprit lesions were found. Plan is to repeat LHC in a few days. Pharmacy consulted for Heparin  dosing. Patient did receive some heparin  in the Cath lab around 06:00.  Goal of Therapy:  Heparin  level 0.3-0.7 units/ml Monitor platelets by anticoagulation protocol: Yes  07/08 0305 HL 0.22, subtherapeutic 07/08 1211 HL 0.26, subtherapeutic   Plan:  --Heparin  1000 unit IV bolus and increase heparin  infusion rate to 1450 units/hr --Will re-check HL in 8 hr after rate change --CBC daily while on heparin   Marolyn KATHEE Mare 08/18/2023 12:45 PM

## 2023-08-18 NOTE — Telephone Encounter (Signed)
 Patient Product/process development scientist completed.    The patient is insured through [employer]. Patient has Medicare and is not eligible for a copay card, but may be able to apply for patient assistance or Medicare RX Payment Plan (Patient Must reach out to their plan, if eligible for payment plan), if available.    Ran test claim for Entresto 24-26 mg and the current 30 day co-pay is $177.64.  Ran test claim for Farxiga 10 mg and the current 30 day co-pay is $151.05.  Ran test claim for Jardiance 10 mg and the current 30 day co-pay is $158.54  This test claim was processed through [employer]- copay amounts may vary at other pharmacies due to [employer], or as the patient moves through the different stages of their insurance plan.     Morgan Arab, CPHT Pharmacy Technician III Certified Patient Advocate [facility] Pharmacy Patient Advocate Team Direct Number: [phone]  Fax: [phone]

## 2023-08-18 NOTE — Progress Notes (Signed)
 Heart Failure Stewardship Pharmacy Note  PCP: [name], MD PCP-Cardiologist: None  HPI: Cody Herrera is a 78 y.o. male with hypertension, hyperlipidemia, CAD who presented with chest pain, nausea, and vomiting. EKG showed anterior STEMI and patient was brought emergently to cath lab, however no culprit lesion was identified and initially Takotsubo was suspected. Ostial LAD and first diagonal areas noted to be hazy. This along with EKG and echocardiogram results are consistent with anterior STEMI with recannulization per Cardiology. On admission, BNP was 35.5, HS-troponin was 13,252 > 23,394, and lactic acid was 4.4. No chest x-ray noted.   Pertinent cardiac history: LHC 03/2009 with 90% RCA stenosis, treated with DES. Stress test in 09/2017 with no significant ischemia, LVEF 64%. LHC 08/2023 showed patent RCA stent, mild nonobstructive disease of the LAD, and haziness of the ostial LAD and first diagonal suggesting potential plaque rupture with spontaneous recannulization. Echocardiogram 08/2023 showed LVEF of 30-35%, severe LV dilation, mild asymmetric hypertrophy of the basal/septal segments.  Pertinent Lab Values: Creatinine  Date Value Ref Range Status  12/30/2012 1.00 0.60 - 1.30 mg/dL Final   Creatinine, Ser  Date Value Ref Range Status  08/18/2023 0.87 0.61 - 1.24 mg/dL Final   BUN  Date Value Ref Range Status  08/18/2023 17 8 - 23 mg/dL Final  88/79/7985 21 (H) 7 - 18 mg/dL Final   Potassium  Date Value Ref Range Status  08/18/2023 3.5 3.5 - 5.1 mmol/L Final  12/30/2012 3.3 (L) 3.5 - 5.1 mmol/L Final   Sodium  Date Value Ref Range Status  08/18/2023 137 135 - 145 mmol/L Final  12/30/2012 140 136 - 145 mmol/L Final   B Natriuretic Peptide  Date Value Ref Range Status  08/17/2023 35.5 0.0 - 100.0 pg/mL Final    Comment:    Performed at [facility], [address] [place], [place] [address]   Magnesium  Date Value Ref Range Status   08/18/2023 1.7 1.7 - 2.4 mg/dL Final    Comment:    Performed at [facility], [address] [place], [place] [address]   Hgb A1c MFr Bld  Date Value Ref Range Status  12/11/2018 5.7 (H) 4.8 - 5.6 % Final    Comment:    (NOTE) Pre diabetes:          5.7%-6.4% Diabetes:              >6.4% Glycemic control for   <7.0% adults with diabetes    TSH  Date Value Ref Range Status  12/11/2018 8.169 (H) 0.350 - 4.500 uIU/mL Final    Comment:    Performed by a 3rd Generation assay with a functional sensitivity of <=0.01 uIU/mL. Performed at [facility], [address] [place], [place] [address]     Vital Signs:  Temp:  [99 F (37.2 C)-100 F (37.8 C)] 99 F (37.2 C) (07/08 0800) Pulse Rate:  [69-108] 86 (07/08 0900) Cardiac Rhythm: Heart block (07/08 0730) Resp:  [19-27] 26 (07/08 0900) BP: (112-160)/(65-103) 112/101 (07/08 0900) SpO2:  [90 %-98 %] 93 % (07/08 0900)  Intake/Output Summary (Last 24 hours) at 08/18/2023 1043 Last data filed at 08/18/2023 1023 Gross per 24 hour  Intake 542.63 ml  Output --  Net 542.63 ml    Current Heart Failure Medications:  Loop diuretic: none Beta-Blocker: metoprolol  tartrate 50 mg BID ACEI/ARB/ARNI: irbesartan  75 mg daily MRA: none SGLT2i: none Other: none  Prior to admission Heart Failure Medications:  Loop diuretic: none  Beta-Blocker: atenolol  100 mg daily ACEI/ARB/ARNI: lisinopril  40 mg daily MRA: none SGLT2i: none Other: none  Assessment: 1. Acute systolic heart failure (LVEF 30-35%)  , due to ICM. NYHA class II-III symptoms.  -Symptoms: Reports feeling improved today. Denies nausea or chest pain. Difficult to wake up, though involved in conversation when awake. Reports mild fatigue. Appetite has been poor for several days. -Volume: No LEE, cough, or orthopnea. On cath yesterday, LVEDP was 15. Not currently on diuretics. JVP appears somewhat elevated, though difficult to assess. Given patient is  asymptomatic with stable filling pressures yesterday, no diuretics currently ordered. -Hemodynamics: BP previously elevated, now trending down. Most recent pulse pressure was 11, though this may be inaccurate and an outlier. Recommend following with recheck. Lactic acid has not cleared yet and patient still appears fatigued with poor appetite. -BB: Currently on metoprolol  tartrate 50 mg BID. Would consolidate to succinate prior to discharge.Given lactic acid is elevated and patient has poor appetite, recommend dose reduction of BB by 50% for the time being. Can titrate further outpatient. -ACEI/ARB/ARNI: Continue irbesartan  75 mg daily.  -MRA: Consider adding spironolactone  12.5 mg daily today with hypokalemia. -SGLT2i: Consider adding SGLT2i, may require a grant if started. -Recheck lactic acid this afternoon or tomorrow morning to ensure it has cleared.  Plan: 1) Medication changes recommended at this time: -Consider adding spironolactone  12.5 mg daily -Consider decreasing metoprolol  to 25 mg BID.   2) Patient assistance: -Entresto  copay is $177.64, Farxiga  copay is $151.05, Jardiance copay is $158.54   3) Education: - Patient has been educated on current HF medications and potential additions to HF medication regimen - Patient verbalizes understanding that over the next few months, these medication doses may change and more medications may be added to optimize HF regimen - Patient has been educated on basic disease state pathophysiology and goals of therapy  Medication Assistance / Insurance Benefits Check: Does the patient have prescription insurance?    Type of insurance plan:  Does the patient qualify for medication assistance through manufacturers or grants? Pending   Outpatient Pharmacy: Prior to admission outpatient pharmacy: [facility]      Please do not hesitate to reach out with questions or concerns,  Jaun Bash, PharmD, CPP, BCPS Heart Failure Pharmacist  Phone -  [phone] 08/18/2023 10:43 AM

## 2023-08-18 NOTE — Progress Notes (Signed)
 PHARMACY - ANTICOAGULATION CONSULT NOTE  Pharmacy Consult for Heparin  Infusion Indication: chest pain/ACS  No Known Allergies  Patient Measurements: Height: 5' 7 (170.2 cm) Weight: 89.5 kg (197 lb 6.4 oz) IBW/kg (Calculated) : 66.1 HEPARIN  DW (KG): 84.7  Vital Signs: Temp: 99 F (37.2 C) (07/08 0000) Temp Source: Oral (07/08 0000) BP: 122/74 (07/08 0200) Pulse Rate: 77 (07/08 0200)  Labs: Recent Labs    08/17/23 0824 08/17/23 1024 08/18/23 0305  HGB 15.0  --  13.8  HCT 45.9  --  41.5  PLT 203  --  155  HEPARINUNFRC  --   --  0.22*  CREATININE 0.92  --   --   TROPONINIHS 13,252* 23,394*  --     Estimated Creatinine Clearance: 72.9 mL/min (by C-G formula based on SCr of 0.92 mg/dL).   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 EKG demostrates significant ST elevations, patient was taken to the Cath lab for [facility] and no culprit lesions were found. Plan is to repeat LHC in a few days. Pharmacy consulted for Heparin  dosing. Patient did receive some heparin  in the Cath lab around 06:00.  Goal of Therapy:  Heparin  level 0.3-0.7 units/ml Monitor platelets by anticoagulation protocol: Yes  07/08 0305 HL 0.22, subtherapeutic   Plan:  Bolus 1300 units x 1 Increase heparin  infusion to 1300 units/hr Will recheck HL in 8 hr after rate change CBC daily while on heparin   Rankin CANDIE Dills, PharmD, [facility] 08/18/2023 3:41 AM

## 2023-08-18 NOTE — Progress Notes (Signed)
 PHARMACY - ANTICOAGULATION CONSULT NOTE  Pharmacy Consult for Heparin  Infusion Indication: chest pain/ACS  Patient Measurements: Height: 5' 7 (170.2 cm) Weight: 89.5 kg (197 lb 6.4 oz) IBW/kg (Calculated) : 66.1 HEPARIN  DW (KG): 84.7  Labs: Recent Labs    08/17/23 0824 08/17/23 1024 08/18/23 0305 08/18/23 0618 08/18/23 1211 08/18/23 2107  HGB 15.0  --  13.8  --   --   --   HCT 45.9  --  41.5  --   --   --   PLT 203  --  155  --   --   --   HEPARINUNFRC  --   --  0.22*  --  0.26* 0.38  CREATININE 0.92  --   --  0.87  --   --   TROPONINIHS 13,252* 23,394*  --   --   --   --     Estimated Creatinine Clearance: 77.1 mL/min (by C-G formula based on SCr of 0.87 mg/dL).   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 EKG demostrates significant ST elevations, patient was taken to the Cath lab for [facility] and no culprit lesions were found. Plan is to repeat LHC in a few days. Pharmacy consulted for Heparin  dosing. Patient did receive some heparin  in the Cath lab around 06:00.  Goal of Therapy:  Heparin  level 0.3-0.7 units/ml Monitor platelets by anticoagulation protocol: Yes  07/08 0305 HL 0.22, subtherapeutic 07/08 1211 HL 0.26, subtherapeutic 07/08 2107 HL 0.38, therapeutic x 1   Plan: --Continue heparin  infusion at 1450 units/hour --Heparin  1000 unit IV bolus and increase heparin  infusion rate to 1450 units/hr --Check confirmatory HL with AM labs tomorrow --CBC daily while on heparin   Will M. Lenon, PharmD Clinical Pharmacist 08/18/2023 9:39 PM

## 2023-08-18 NOTE — H&P (View-Only) (Signed)
 Progress Note  Patient Name: Cody Herrera Date of Encounter: 08/18/2023 [facility] Cardiologist:New  Interval Summary    Patient denies chest pain. Cath site is stable, improving. Vitals look good. Lactic acid trending down.   Vital Signs Vitals:   08/18/23 0900 08/18/23 0930 08/18/23 1000 08/18/23 1100  BP: (!) 112/101  109/60 108/76  Pulse: 86  82 90  Resp: (!) 26  (!) 24 (!) 21  Temp:      TempSrc:      SpO2: 93% 96% 93% 91%  Weight:      Height:        Intake/Output Summary (Last 24 hours) at 08/18/2023 1346 Last data filed at 08/18/2023 1136 Gross per 24 hour  Intake 593.17 ml  Output --  Net 593.17 ml      08/17/2023    5:34 AM 12/11/2018    3:45 AM 12/11/2018   12:17 AM  Last 3 Weights  Weight (lbs) 197 lb 6.4 oz 188 lb 0.8 oz 190 lb  Weight (kg) 89.54 kg 85.3 kg 86.183 kg      Telemetry/ECG  NSR 70s, PVCs - Personally Reviewed  Physical Exam  GEN: No acute distress.   Neck: No JVD Cardiac: RRR, no murmurs, rubs, or gallops.  Respiratory: Clear to auscultation bilaterally. GI: Soft, nontender, non-distended  MS: No edema  Cardiac Studies:  Echo 08/17/23 1. Basal function preserved septal, apical distal anterior wall and  inferior apical akinesis Findings consistent with LAD infarct vs Takatsubo  DCM. Left ventricular ejection fraction, by estimation, is 30 to 35%. The  left ventricle has moderately  decreased function. The left ventricle has no regional wall motion  abnormalities. The left ventricular internal cavity size was severely  dilated. There is mild asymmetric left ventricular hypertrophy of the  basal and septal segments. Left ventricular  diastolic parameters were normal.   2. Right ventricular systolic function is normal. The right ventricular  size is normal.   3. The mitral valve is normal in structure. No evidence of mitral valve  regurgitation. No evidence of mitral stenosis.   4. The aortic valve is normal in  structure. Aortic valve regurgitation is  mild. No aortic stenosis is present.   5. Aortic dilatation noted. There is severe dilatation of the aortic  root, measuring 48 mm. There is dilatation of the ascending aorta,  measuring 48 mm.   6. The inferior vena cava is normal in size with greater than 50%  respiratory variability, suggesting right atrial pressure of 3 mmHg.    Cardiac cath 08/17/23   Ost LAD to Prox LAD lesion is 20% stenosed with 40% stenosed side branch in 1st Diag.   Previously placed Prox LAD to Mid LAD stent is 5% stenosed.   Previously placed Prox RCA DES stent is  widely patent.   The left ventricular ejection fraction is 35-45% by visual estimate.  Apical ballooning/hypokinesis, with basal hypokinesis   There is no aortic valve stenosis.   Dominance: Right  Widely patent RCA and proximal LAD stent. No obvious culprit lesion besides potentially poor distal flow in the LAD. Nothing to explain the extensive ST elevations with minimal pain. ->  Cannot exclude potential hazy segment in the very ostial LAD but not occlusive. LV gram shows clear evidence of apical ballooning suggestive of Takotsubo with relative normal LVEDP        Check 2D echo-consider proximal relook angiogram in 1 to 2 days with intravascular imaging Follow cardiac enzymes Needed  sure what medications he is taking, will start Lopressor  50 mg twice daily, and irbesartan  75 mg daily. Rosuvastatin  40 mg daily As needed narcotics for pain relief   Will run Aggrastat  for 3 hours and start aspirin  81 mg daily; would not initiate oral antiplatelet until relook cath.    Assessment & Plan   Anterior STEMI - cath showed no obvious culprit lesion, patent RCA and pLCA stent, ostial LAD and ostial 1st diag was hazy - post-cath hematoma resolved. Hgb stable - echo showed LVEF 30-35%, anterior wall inferior apical akinesis, severe dilation of aortic root - restarted on IV heparin  to complete 48 hours, can stop  tomorrow PM - continue ASA 81mg  daily, metoprolol  50mg  BID and crestor  40mg  daily - interventionalist recommended re-look cath to interrogate ostial LAD area, however patient is unsure if he wants to pursue this as he is anxious to get home.   HFrEF - echo this admission showed reduced LVEF 30-35% - MPI in 2019 showed EF 64% - not felt to be stress induced, suspected ICM - plan as above - continue irbesartan  and metoprolol  - continue GDMT as able - repeat echo in 2-3 months  Lactic acidosis - no signs of shock - LA 4.4>2.5 - can re-check tomorrow - vitals OK, BP soft  HTN - blood pressures improved on metoprolol  50mg  BID and Irbesartan  75mg  daily  HLD - LDL 54 - Crestor  40mg  daily     For questions or updates, please contact [facility] Please consult [email or web address] for contact info under       Signed, Hisayo Delossantos VEAR Fishman, PA-C

## 2023-08-18 NOTE — Progress Notes (Signed)
 Patient alert, on room air, with no complaints of pain. He is tolerating his diet and getting up with assistance. Heparin  drip infusing. Right wrist level 2. MD in to speak with patient regarding a possible 2nd cardiac cath. No orders entered at this time. Continue to assess. Report given to [facility].

## 2023-08-18 NOTE — Progress Notes (Signed)
 The patient was seen and examined in the ICU.  The case was discussed with Dr. Anner who performed the cardiac catheterization.  Shortly after catheterization, the patient was noted to have hematoma in the right arm above the TR band.  Aggrastat  infusion was held.  Manual pressure and replacement of the TR band was performed by nursing and Cath Lab staff with improvement. The patient reported 2 brief episodes of chest pain throughout the day that did not require intervention.  He is currently chest pain-free but does feel uncomfortable in bed. Lactic acid was mildly elevated but no evidence of cardiogenic shock at least by vital signs. I personally reviewed his cardiac catheterization images.  No clear culprit was identified for anterior ST elevation.  However, the ostial LAD and ostial first diagonal area appeared hazy.  Plaque rupture in that area cannot be excluded. I personally reviewed his echocardiogram which showed moderately reduced LV systolic function with severe hypokinesis in the LAD distribution. The patient's presentation, anterior ST elevation, wall motion on echo and troponin trend, is consistent with anterior ST elevation myocardial infarction with recannulization.  I do not think this represents stress-induced cardiomyopathy.  Recommendations: I elected to start heparin  drip to finish at least 48 hours given that no revascularization has been performed. Monitor for signs of cardiogenic shock for now and will repeat acid in the morning. Consider repeat cardiac catheterization in 24 to 48 hours with intravascular imaging to interrogate the ostial LAD area.  If it is significant, the patient might require surgical revascularization given location and bifurcation with a large diagonal.  I tried calling the patient's brother but there was no answer. Critical care time of 45 minutes.  Deatrice Cage, MD 08/17/2023  6:00 pm

## 2023-08-18 NOTE — Progress Notes (Signed)
 [facility] Progress Note  Patient: Cody Herrera    FMW:[id number]  DOA: 08/17/2023     Date of Service: the patient was seen and examined on 08/18/2023  Chief Complaint  Patient presents with   Code STEMI   Brief hospital course: Cody Herrera is a 78 y.o. male with medical history significant for hypertension, hyperlipidemia, and CAD with history of stent x 1. The patient states that he was in his usual state of health until midday 08/16/2023 when he began having nausea/vomiting and diarrhea. Then around 0400 he began having chest pain as well. He stated that the chest pain was 5/10. EMS was called. They performed an EKG which demonstrated down going T's inferiorly, and elevated ST segments laterally and antero-septally. Code STEMI was call en-route. The patient received ASA 324, NTG x 2 and fentanyl  en route. Upon arrival he received 4000 U of heparin  and went directly to the cath lab. Dr. Anner performed LHC which demonstrated no obvious culprit lesion. There was TIMI-3 flow down all vessels except maybe the apical LAD. LV gram demonstrated possible Takotsubo. CP was 2-3/10 with significant ST elevations, suggesting Takotsubo.    The patient has been returned to the ICU. He will receive Aggrastat  for 3 hours. Echocardiogram has been ordered. Cardiac enzymes will be followed. He has been started on lopressor  50 mg bid, irbesartan  75 mg daily and rosuvastatin  40 mg daily. He will also receive pain control. Possible repeat LHC in a couple of days per cardiology.   Assessment and Plan:  # Anterior STEMI  History of CAD s/p stents Patient presented with chest pain, nausea vomiting and diarrhea. Code STEMI was called, s/p cardiac cath, no obvious culprit lesion, patent RCA and pLCA stent, ostial LAD and ostial 1st diag was hazy  post-cath hematoma resolved. Hgb stable  echo showed LVEF 30-35%, anterior wall inferior apical akinesis, severe dilation of aortic root -  restarted on IV heparin  to complete 48 hours, can stop tomorrow PM Continue aspirin , metoprolol  and Crestor  Cardiology recommended repeat cardiac cath again to look at ostial LAD area but patient is not sure whether he will undergo cardiac cath again.   # Chronic systolic CHF, compensated, no volume on LVEF 30 -35% Continue irbesartan  and metoprolol  Repeat TTE in 2 to 3 months  # Acute gastroenteritis Lactic acid elevated possible due to GI loss No signs of sepsis Currently patient is asymptomatic, GI symptoms have resolved Continue symptomatic treatment  # HTN, HLD Continue irbesartan , metoprolol  and statin LDL 45 below goal <70 Monitor BP and titrate medications accordingly    Body mass index is 30.92 kg/m.  Interventions:  Diet: Heart healthy diet DVT Prophylaxis: Hep gtt   Advance goals of care discussion: Full code  Family Communication: family was not present at bedside, at the time of interview.  The pt provided permission to discuss medical plan with the family. Opportunity was given to ask question and all questions were answered satisfactorily.   Disposition:  Pt is from Home, admitted with anterior STEMI, still on Hep gtt x 48 hrs till 7/9 pm, which precludes a safe discharge. Discharge to Home, when stable and cleared by cardiology.  Subjective: No significant events overnight.  Patient denies any chest pain or palpitation, no nausea vomiting or diarrhea, no abdominal pain.   Physical Exam: General: NAD, lying comfortably Appear in no distress, affect appropriate Eyes: PERRLA ENT: Oral Mucosa Clear, moist  Neck: no JVD,  Cardiovascular: S1 and S2 Present, no Murmur,  Respiratory: good respiratory effort, Bilateral Air entry equal and Decreased, no Crackles, no wheezes Abdomen: Bowel Sound present, Soft and no tenderness,  Skin: no rashes Extremities: no Pedal edema, no calf tenderness Neurologic: without any new focal findings Gait not checked due to  patient safety concerns  Vitals:   08/18/23 1000 08/18/23 1100 08/18/23 1200 08/18/23 1300  BP: 109/60 108/76 111/68 112/67  Pulse: 82 90 76 81  Resp: (!) 24 (!) 21 (!) 25 (!) 25  Temp:   99.6 F (37.6 C)   TempSrc:      SpO2: 93% 91% 95% 94%  Weight:      Height:        Intake/Output Summary (Last 24 hours) at 08/18/2023 1417 Last data filed at 08/18/2023 1410 Gross per 24 hour  Intake 634.13 ml  Output --  Net 634.13 ml   Filed Weights   08/17/23 0534  Weight: 89.5 kg    Data Reviewed: I have personally reviewed and interpreted daily labs, tele strips, imagings as discussed above. I reviewed all nursing notes, pharmacy notes, vitals, pertinent old records I have discussed plan of care as described above with RN and patient/family.  CBC: Recent Labs  Lab 08/17/23 0824 08/18/23 0305  WBC 14.8* 10.3  NEUTROABS 13.8*  --   HGB 15.0 13.8  HCT 45.9 41.5  MCV 91.3 91.4  PLT 203 155   Basic Metabolic Panel: Recent Labs  Lab 08/17/23 0824 08/18/23 0618  NA 140 137  K 4.0 3.5  CL 101 102  CO2 25 25  GLUCOSE 163* 134*  BUN 19 17  CREATININE 0.92 0.87  CALCIUM  9.0 8.3*  MG 1.9 1.7  PHOS  --  2.5    Studies: No results found.  Scheduled Meds:  aspirin  EC  81 mg Oral Daily   Chlorhexidine  Gluconate Cloth  6 each Topical Nightly   irbesartan   75 mg Oral Daily   metoprolol  tartrate  50 mg Oral BID   pantoprazole   40 mg Oral BID   rosuvastatin   40 mg Oral Daily   sodium chloride  flush  3 mL Intravenous Q12H   Continuous Infusions:  heparin  1,450 Units/hr (08/18/23 1410)   PRN Meds: acetaminophen , Heparin  (Porcine) in NaCl, morphine  injection, nitroGLYCERIN , ondansetron  (ZOFRAN ) IV, mouth rinse, sodium chloride  flush  Time spent: 55 minutes  Author: ELVAN SOR. MD [facility] 08/18/2023 2:17 PM  To reach On-call, see care teams to locate the attending and reach out to them via [email or web address]. If 7PM-7AM, please contact night-coverage If you still  have difficulty reaching the attending provider, please page the [facility] (Director on Call) for [facility] on amion for assistance.

## 2023-08-19 ENCOUNTER — Encounter
Admission: EM | Disposition: A | Discharge status: Home / Self Care | Payer: Self-pay | Source: Home / Self Care | Attending: Student

## 2023-08-19 ENCOUNTER — Encounter: Payer: Self-pay | Admitting: Internal Medicine

## 2023-08-19 DIAGNOSIS — I2109 ST elevation (STEMI) myocardial infarction involving other coronary artery of anterior wall: Secondary | ICD-10-CM | POA: Diagnosis not present

## 2023-08-19 DIAGNOSIS — I213 ST elevation (STEMI) myocardial infarction of unspecified site: Secondary | ICD-10-CM

## 2023-08-19 DIAGNOSIS — I255 Ischemic cardiomyopathy: Secondary | ICD-10-CM

## 2023-08-19 DIAGNOSIS — I2510 Atherosclerotic heart disease of native coronary artery without angina pectoris: Secondary | ICD-10-CM

## 2023-08-19 HISTORY — PX: CORONARY ANGIOGRAPHY: CATH118303

## 2023-08-19 LAB — HEPATIC FUNCTION PANEL
ALT: 28 U/L (ref 0–44)
AST: 78 U/L — ABNORMAL HIGH (ref 15–41)
Albumin: 3.3 g/dL — ABNORMAL LOW (ref 3.5–5.0)
Alkaline Phosphatase: 53 U/L (ref 38–126)
Bilirubin, Direct: 0.2 mg/dL (ref 0.0–0.2)
Indirect Bilirubin: 0.7 mg/dL (ref 0.3–0.9)
Total Bilirubin: 0.9 mg/dL (ref 0.0–1.2)
Total Protein: 6.1 g/dL — ABNORMAL LOW (ref 6.5–8.1)

## 2023-08-19 LAB — CBC
HCT: 40.4 % (ref 39.0–52.0)
Hemoglobin: 13.3 g/dL (ref 13.0–17.0)
MCH: 29.8 pg (ref 26.0–34.0)
MCHC: 32.9 g/dL (ref 30.0–36.0)
MCV: 90.6 fL (ref 80.0–100.0)
Platelets: 143 K/uL — ABNORMAL LOW (ref 150–400)
RBC: 4.46 MIL/uL (ref 4.22–5.81)
RDW: 13.2 % (ref 11.5–15.5)
WBC: 6.6 K/uL (ref 4.0–10.5)
nRBC: 0 % (ref 0.0–0.2)

## 2023-08-19 LAB — BASIC METABOLIC PANEL WITH GFR
Anion gap: 9 (ref 5–15)
BUN: 17 mg/dL (ref 8–23)
CO2: 25 mmol/L (ref 22–32)
Calcium: 8.4 mg/dL — ABNORMAL LOW (ref 8.9–10.3)
Chloride: 104 mmol/L (ref 98–111)
Creatinine, Ser: 0.85 mg/dL (ref 0.61–1.24)
GFR, Estimated: >60 mL/min (ref >60)
Glucose, Bld: 116 mg/dL — ABNORMAL HIGH (ref 70–99)
Potassium: 3.5 mmol/L (ref 3.5–5.1)
Sodium: 138 mmol/L (ref 135–145)

## 2023-08-19 LAB — MAGNESIUM: Magnesium: 1.9 mg/dL (ref 1.7–2.4)

## 2023-08-19 LAB — LIPOPROTEIN A (LPA): Lipoprotein (a): 49.3 nmol/L — ABNORMAL HIGH (ref <75.0)

## 2023-08-19 LAB — HEPARIN LEVEL (UNFRACTIONATED): Heparin Unfractionated: 0.43 [IU]/mL (ref 0.30–0.70)

## 2023-08-19 LAB — PHOSPHORUS: Phosphorus: 1.8 mg/dL — ABNORMAL LOW (ref 2.5–4.6)

## 2023-08-19 LAB — LACTIC ACID, PLASMA: Lactic Acid, Venous: 1.7 mmol/L (ref 0.5–1.9)

## 2023-08-19 SURGERY — CORONARY ANGIOGRAPHY (CATH LAB)
Anesthesia: Moderate Sedation

## 2023-08-19 MED ORDER — SODIUM CHLORIDE 0.9 % IV SOLN: 250.0000 mL | INTRAVENOUS | Status: DC | PRN

## 2023-08-19 MED ORDER — CYANOCOBALAMIN 1000 MCG/ML IJ SOLN
1000.0000 ug | Freq: Every day | INTRAMUSCULAR | Status: DC
  Administered 2023-08-19 – 2023-08-20 (×2): 1000 ug via INTRAMUSCULAR
  Filled 2023-08-19 (×2): qty 1

## 2023-08-19 MED ORDER — POTASSIUM PHOSPHATES 15 MMOLE/5ML IV SOLN
30.0000 mmol | Freq: Once | INTRAVENOUS | Status: AC
  Administered 2023-08-19: 30 mmol via INTRAVENOUS
  Filled 2023-08-19: qty 10

## 2023-08-19 MED ORDER — IOHEXOL 300 MG/ML  SOLN
INTRAMUSCULAR | Status: DC | PRN
  Administered 2023-08-19: 42 mL

## 2023-08-19 MED ORDER — VERAPAMIL HCL 2.5 MG/ML IV SOLN
INTRAVENOUS | Status: AC
Start: 2023-08-19 — End: 2023-08-19
  Filled 2023-08-19: qty 2

## 2023-08-19 MED ORDER — LIDOCAINE HCL (PF) 1 % IJ SOLN
INTRAMUSCULAR | Status: DC | PRN
Start: 2023-08-19 — End: 2023-08-19
  Administered 2023-08-19: 2 mL

## 2023-08-19 MED ORDER — ASPIRIN 81 MG PO CHEW: 81.0000 mg | CHEWABLE_TABLET | ORAL | Status: DC

## 2023-08-19 MED ORDER — VERAPAMIL HCL 2.5 MG/ML IV SOLN
INTRAVENOUS | Status: DC | PRN
  Administered 2023-08-19: 2.5 mg via INTRA_ARTERIAL

## 2023-08-19 MED ORDER — LIDOCAINE HCL 1 % IJ SOLN
INTRAMUSCULAR | Status: AC
  Filled 2023-08-19: qty 20

## 2023-08-19 MED ORDER — SODIUM CHLORIDE 0.9 % WEIGHT BASED INFUSION
3.0000 mL/kg/h | INTRAVENOUS | Status: DC
  Administered 2023-08-19: 3 mL/kg/h via INTRAVENOUS

## 2023-08-19 MED ORDER — HEPARIN (PORCINE) IN NACL 1000-0.9 UT/500ML-% IV SOLN
INTRAVENOUS | Status: AC
  Filled 2023-08-19: qty 1000

## 2023-08-19 MED ORDER — MIDAZOLAM HCL 2 MG/2ML IJ SOLN
INTRAMUSCULAR | Status: DC | PRN
  Administered 2023-08-19: 1 mg via INTRAVENOUS

## 2023-08-19 MED ORDER — VITAMIN B-12 1000 MCG PO TABS: 1000.0000 ug | ORAL_TABLET | Freq: Every day | ORAL | Status: DC

## 2023-08-19 MED ORDER — HEPARIN SODIUM (PORCINE) 1000 UNIT/ML IJ SOLN
INTRAMUSCULAR | Status: AC
  Filled 2023-08-19: qty 10

## 2023-08-19 MED ORDER — MIDAZOLAM HCL 2 MG/2ML IJ SOLN
INTRAMUSCULAR | Status: AC
  Filled 2023-08-19: qty 2

## 2023-08-19 MED ORDER — HEPARIN SODIUM (PORCINE) 1000 UNIT/ML IJ SOLN
INTRAMUSCULAR | Status: DC | PRN
  Administered 2023-08-19: 4500 [IU] via INTRAVENOUS

## 2023-08-19 MED ORDER — CLOPIDOGREL BISULFATE 75 MG PO TABS
ORAL_TABLET | ORAL | Status: DC | PRN
  Administered 2023-08-19: 600 mg via ORAL

## 2023-08-19 MED ORDER — SODIUM CHLORIDE 0.9% FLUSH
3.0000 mL | Freq: Two times a day (BID) | INTRAVENOUS | Status: DC
  Administered 2023-08-19: 3 mL via INTRAVENOUS

## 2023-08-19 MED ORDER — METOPROLOL SUCCINATE ER 50 MG PO TB24
50.0000 mg | ORAL_TABLET | Freq: Every day | ORAL | Status: DC
  Administered 2023-08-20: 50 mg via ORAL
  Filled 2023-08-19: qty 1

## 2023-08-19 MED ORDER — FENTANYL CITRATE (PF) 100 MCG/2ML IJ SOLN
INTRAMUSCULAR | Status: DC | PRN
  Administered 2023-08-19: 25 ug via INTRAVENOUS

## 2023-08-19 MED ORDER — ENOXAPARIN SODIUM 40 MG/0.4ML IJ SOSY
40.0000 mg | PREFILLED_SYRINGE | INTRAMUSCULAR | Status: DC
  Administered 2023-08-20: 40 mg via SUBCUTANEOUS
  Filled 2023-08-19: qty 0.4

## 2023-08-19 MED ORDER — SODIUM CHLORIDE 0.9 % WEIGHT BASED INFUSION
1.0000 mL/kg/h | INTRAVENOUS | Status: DC
  Administered 2023-08-19: 1 mL/kg/h via INTRAVENOUS

## 2023-08-19 MED ORDER — HEPARIN (PORCINE) IN NACL 2000-0.9 UNIT/L-% IV SOLN
INTRAVENOUS | Status: DC | PRN
  Administered 2023-08-19: 1000 mL

## 2023-08-19 MED ORDER — CLOPIDOGREL BISULFATE 75 MG PO TABS
ORAL_TABLET | ORAL | Status: AC
Start: 2023-08-19 — End: 2023-08-19
  Filled 2023-08-19: qty 8

## 2023-08-19 MED ORDER — CLOPIDOGREL BISULFATE 75 MG PO TABS
75.0000 mg | ORAL_TABLET | Freq: Every day | ORAL | Status: DC
  Administered 2023-08-20: 75 mg via ORAL
  Filled 2023-08-19: qty 1

## 2023-08-19 MED ORDER — SODIUM CHLORIDE 0.9% FLUSH: 3.0000 mL | INTRAVENOUS | Status: DC | PRN

## 2023-08-19 MED ORDER — FENTANYL CITRATE (PF) 100 MCG/2ML IJ SOLN
INTRAMUSCULAR | Status: AC
Start: 2023-08-19 — End: 2023-08-19
  Filled 2023-08-19: qty 2

## 2023-08-19 SURGICAL SUPPLY — 9 items
CATH LAUNCHER 6FR EBU3.5 (CATHETERS) IMPLANT
DEVICE RAD TR BAND REGULAR (VASCULAR PRODUCTS) IMPLANT
DRAPE BRACHIAL (DRAPES) IMPLANT
GLIDESHEATH SLEND SS 6F .021 (SHEATH) IMPLANT
GUIDEWIRE INQWIRE 1.5J.035X260 (WIRE) IMPLANT
PACK CARDIAC CATH (CUSTOM PROCEDURE TRAY) ×1 IMPLANT
SET ATX-X65L (MISCELLANEOUS) IMPLANT
STATION PROTECTION PRESSURIZED (MISCELLANEOUS) IMPLANT
TUBING CIL FLEX 10 FLL-RA (TUBING) IMPLANT

## 2023-08-19 NOTE — Plan of Care (Signed)
  Problem: Education: Goal: Knowledge of General Education information will improve Description: Including pain rating scale, medication(s)/side effects and non-pharmacologic comfort measures Outcome: Progressing   Problem: Health Behavior/Discharge Planning: Goal: Ability to manage health-related needs will improve Outcome: Progressing   Problem: Clinical Measurements: Goal: Ability to maintain clinical measurements within normal limits will improve Outcome: Progressing Goal: Will remain free from infection Outcome: Progressing Goal: Diagnostic test results will improve Outcome: Progressing Goal: Respiratory complications will improve Outcome: Progressing Goal: Cardiovascular complication will be avoided Outcome: Progressing   Problem: Activity: Goal: Risk for activity intolerance will decrease Outcome: Progressing   Problem: Nutrition: Goal: Adequate nutrition will be maintained Outcome: Progressing   Problem: Coping: Goal: Level of anxiety will decrease Outcome: Progressing   Problem: Elimination: Goal: Will not experience complications related to bowel motility Outcome: Progressing Goal: Will not experience complications related to urinary retention Outcome: Progressing   Problem: Pain Managment: Goal: General experience of comfort will improve and/or be controlled Outcome: Progressing   Problem: Safety: Goal: Ability to remain free from injury will improve Outcome: Progressing   Problem: Skin Integrity: Goal: Risk for impaired skin integrity will decrease Outcome: Progressing   Problem: Education: Goal: Understanding of CV disease, CV risk reduction, and recovery process will improve Outcome: Progressing Goal: Individualized Educational Video(s) Outcome: Progressing   Problem: Activity: Goal: Ability to return to baseline activity level will improve Outcome: Progressing   Problem: Cardiovascular: Goal: Ability to achieve and maintain adequate  cardiovascular perfusion will improve Outcome: Progressing Goal: Vascular access site(s) Level 0-1 will be maintained Outcome: Progressing   Problem: Health Behavior/Discharge Planning: Goal: Ability to safely manage health-related needs after discharge will improve Outcome: Progressing   Problem: Education: Goal: Understanding of cardiac disease, CV risk reduction, and recovery process will improve Outcome: Progressing Goal: Individualized Educational Video(s) Outcome: Progressing   Problem: Activity: Goal: Ability to tolerate increased activity will improve Outcome: Progressing   Problem: Cardiac: Goal: Ability to achieve and maintain adequate cardiovascular perfusion will improve Outcome: Progressing   Problem: Health Behavior/Discharge Planning: Goal: Ability to safely manage health-related needs after discharge will improve Outcome: Progressing

## 2023-08-19 NOTE — Plan of Care (Signed)
   Problem: Education: Goal: Knowledge of General Education information will improve Description Including pain rating scale, medication(s)/side effects and non-pharmacologic comfort measures Outcome: Progressing

## 2023-08-19 NOTE — Progress Notes (Signed)
 Heart Failure Nurse Navigator Progress Note  PCP: [name], MD PCP-Cardiologist: [facility] Admission Diagnosis: ST elevation myocardial infarction (STEMI), unspecified artery [facility]) Admitted from: Home via EMS  Presentation:   Cody Herrera presented with chest pain in the center of his chest, non radiating and severe. Patient had significant nausea, vomiting and diarrhea most tof the day prior to this event.   Hx: Hypertension, hyperlipidemia,  CAD status post 1 prior coronary stent.  EMS gave 2 nitroglycerin , 324 mg of aspirin  and 50 mics of fentanyl  with pain improvement of 3/10.  STEMI was activated during transport. Patient brought emergently to the cardia catheterization lab. BNP 35.5. HS-Troponin 13,252 > 23,394, and lactic acid was 4.4   ECHO/ LVEF: 30-35%  Clinical Course:  Past Medical History:  Diagnosis Date   Actinic keratosis    Dysplastic nevus 07/05/2020   left mid back, severe atypia, close to margin, clear 10/01/22   Hypercholesteremia    Hyperlipidemia    Hypertension      Social History   Socioeconomic History   Marital status: Single    Spouse name: Not on file   Number of children: Not on file   Years of education: Not on file   Highest education level: Not on file  Occupational History   Not on file  Tobacco Use   Smoking status: Never   Smokeless tobacco: Never  Substance and Sexual Activity   Alcohol use: Yes    Comment: ocassinally   Drug use: Never   Sexual activity: Not on file  Other Topics Concern   Not on file  Social History Narrative   Not on file   Social Drivers of Health   Financial Resource Strain: Not on file  Food Insecurity: No Food Insecurity (08/17/2023)   Hunger Vital Sign    Worried About Running Out of Food in the Last Year: Never true    Ran Out of Food in the Last Year: Never true  Transportation Needs: No Transportation Needs (08/17/2023)   PRAPARE - Scientist, research (physical sciences) (Medical): No    Lack of Transportation (Non-Medical): No  Physical Activity: Not on file  Stress: Not on file  Social Connections: Moderately Integrated (08/17/2023)   Social Connection and Isolation Panel    Frequency of Communication with Friends and Family: More than three times a week    Frequency of Social Gatherings with Friends and Family: More than three times a week    Attends Religious Services: More than 4 times per year    Active Member of [employer] or Organizations: Yes    Attends Engineer, structural: More than 4 times per year    Marital Status: Divorced   Education Assessment and Provision:  Detailed education and instructions provided on heart failure disease management including the following:  Signs and symptoms of Heart Failure When to call the physician Importance of daily weights Low sodium diet Fluid restriction Medication management Anticipated future follow-up appointments  Patient education given on each of the above topics.  Patient acknowledges understanding via teach back method and acceptance of all instructions.  Education Materials:  Living Better With Heart Failure Booklet, HF zone tool, & Daily Weight Tracker Tool.  Patient has scale at home: Yes.   Patient has pill box at home: Yes    High Risk Criteria for Readmission and/or Poor Patient Outcomes: Heart failure hospital admissions (last 6 months): 0  No Show rate: 0 Difficult social situation: None  Demonstrates medication adherence: Yes Primary Language: English Literacy level: Reading, Writing & Comprehension  Barriers of Care:   None  Considerations/Referrals:   Referral made to Heart Failure Pharmacist Stewardship: Yes Referral made to Heart Failure CSW/NCM TOC: No Referral made to Heart & Vascular TOC clinic: Yes . 08/24/23 @ 9:30  Items for Follow-up on DC/TOC: Daily Weights-Per patient he does not do daily weights and denies any swelling except to his  right knee that needs to be replaced.  Diet & Fluid Restrictions- Does not eat any added salt.  Continued Heart Failure Education  Charmaine Pines, RN, BSN [facility] Heart Failure Navigator Secure Chat Only

## 2023-08-19 NOTE — TOC CM/SW Note (Signed)
 Transition of Care [facility]) - Inpatient Brief Assessment   Patient Details  Name: Cody Herrera MRN: [id number] Date of Birth: July 22, 1945  Transition of [facility]) CM/SW Contact:    [name], LCSW Phone Number: 08/19/2023, 9:55 AM   Clinical Narrative: CSW reviewed chart. No TOC needs identified so far. CSW acknowledges consult: Patient requests that we determine if repeat heart catheterization will be approved/covered by his insurance. TOC team does not call insurance to determine if procedures are covered or not. TOC supervisor reached out to [facility] who stated that due to the emergent admission, it should be covered but please make sure you document the reason/concerns that patient requires another heart cath. This information was sent to the cardiology team. CSW will continue to follow progress. Please place another [facility] consult if any needs arise.   Transition of Care Asessment: Insurance and Status: Insurance coverage has been reviewed Patient has primary care physician: Yes Home environment has been reviewed: Single family home Prior level of function:: Not documented Prior/Current Home Services: No current home services Social Drivers of Health Review: SDOH reviewed no interventions necessary Readmission risk has been reviewed: Yes Transition of care needs: no transition of care needs at this time

## 2023-08-19 NOTE — Progress Notes (Signed)
 Progress Note  Patient Name: Cody Herrera Date of Encounter: 08/19/2023 [facility] Cardiologist: New  Interval Summary    Patient denies chest pain. He is agreeable to re-look cath today.   Vital Signs Vitals:   08/18/23 2009 08/18/23 2317 08/19/23 0319 08/19/23 0734  BP: (!) 103/58 104/65 122/78 117/71  Pulse: 71 73 79 78  Resp: 20 20 20 16   Temp: 100.3 F (37.9 C) 98.3 F (36.8 C) 98.1 F (36.7 C) 98.7 F (37.1 C)  TempSrc:      SpO2: 94% 95% 97% 97%  Weight:      Height:        Intake/Output Summary (Last 24 hours) at 08/19/2023 0948 Last data filed at 08/19/2023 0603 Gross per 24 hour  Intake 571.5 ml  Output 225 ml  Net 346.5 ml      08/17/2023    5:34 AM 12/11/2018    3:45 AM 12/11/2018   12:17 AM  Last 3 Weights  Weight (lbs) 197 lb 6.4 oz 188 lb 0.8 oz 190 lb  Weight (kg) 89.54 kg 85.3 kg 86.183 kg      Telemetry/ECG  NSR - Personally Reviewed  Physical Exam  GEN: No acute distress.   Neck: No JVD Cardiac: RRR, no murmurs, rubs, or gallops.  Respiratory: Clear to auscultation bilaterally. GI: Soft, nontender, non-distended  MS: No edema  Cardiac Studies:   Echo 08/17/23 1. Basal function preserved septal, apical distal anterior wall and  inferior apical akinesis Findings consistent with LAD infarct vs Takatsubo  DCM. Left ventricular ejection fraction, by estimation, is 30 to 35%. The  left ventricle has moderately  decreased function. The left ventricle has no regional wall motion  abnormalities. The left ventricular internal cavity size was severely  dilated. There is mild asymmetric left ventricular hypertrophy of the  basal and septal segments. Left ventricular  diastolic parameters were normal.   2. Right ventricular systolic function is normal. The right ventricular  size is normal.   3. The mitral valve is normal in structure. No evidence of mitral valve  regurgitation. No evidence of mitral stenosis.   4. The aortic  valve is normal in structure. Aortic valve regurgitation is  mild. No aortic stenosis is present.   5. Aortic dilatation noted. There is severe dilatation of the aortic  root, measuring 48 mm. There is dilatation of the ascending aorta,  measuring 48 mm.   6. The inferior vena cava is normal in size with greater than 50%  respiratory variability, suggesting right atrial pressure of 3 mmHg.      Cardiac cath 08/17/23   Ost LAD to Prox LAD lesion is 20% stenosed with 40% stenosed side branch in 1st Diag.   Previously placed Prox LAD to Mid LAD stent is 5% stenosed.   Previously placed Prox RCA DES stent is  widely patent.   The left ventricular ejection fraction is 35-45% by visual estimate.  Apical ballooning/hypokinesis, with basal hypokinesis   There is no aortic valve stenosis.   Dominance: Right  Widely patent RCA and proximal LAD stent. No obvious culprit lesion besides potentially poor distal flow in the LAD. Nothing to explain the extensive ST elevations with minimal pain. ->  Cannot exclude potential hazy segment in the very ostial LAD but not occlusive. LV gram shows clear evidence of apical ballooning suggestive of Takotsubo with relative normal LVEDP        Check 2D echo-consider proximal relook angiogram in 1 to 2 days  with intravascular imaging Follow cardiac enzymes Needed sure what medications he is taking, will start Lopressor  50 mg twice daily, and irbesartan  75 mg daily. Rosuvastatin  40 mg daily As needed narcotics for pain relief   Will run Aggrastat  for 3 hours and start aspirin  81 mg daily; would not initiate oral antiplatelet until relook cath.  Assessment & Plan   Anterior STEMI - cath showed no obvious culprit lesion, patent RCA and pLCA stent, ostial LAD and ostial 1st diag was hazy - post-cath hematoma resolved. Hgb stable - echo showed LVEF 30-35%, anterior wall inferior apical akinesis, severe dilation of aortic root - restarted on IV heparin   - continue  ASA 81mg  daily, metoprolol  50mg  BID and crestor  40mg  daily - interventionalist recommended re-look cath to interrogate ostial LAD area, plan for re-look cath today.   HFrEF - echo this admission showed reduced LVEF 30-35% - MPI in 2019 showed EF 64% - not felt to be stress induced, suspected ICM - plan as above - continue irbesartan  and metoprolol  - continue GDMT as able - repeat echo in 2-3 months   Lactic acidosis - no signs of shock - LA 4.4>2.5 - vitals normal   HTN - blood pressures improved on metoprolol  50mg  BID and Irbesartan  75mg  daily   HLD - LDL 54 - Crestor  40mg  daily        For questions or updates, please contact [facility] Please consult [email or web address] for contact info under       Signed, Khizar Fiorella VEAR Fishman, PA-C

## 2023-08-19 NOTE — Care Management Important Message (Signed)
 Important Message  Patient Details  Name: Cody Herrera MRN: [id number] Date of Birth: 07/02/1945   Important Message Given:  Yes - Medicare IM     Cody Herrera 08/19/2023, 11:58 AM

## 2023-08-19 NOTE — Progress Notes (Signed)
 [facility] Progress Note  Patient: Cody Herrera    FMW:[id number]  DOA: 08/17/2023     Date of Service: the patient was seen and examined on 08/19/2023  Chief Complaint  Patient presents with   Code STEMI   Brief hospital course: Cody Herrera is a 78 y.o. male with medical history significant for hypertension, hyperlipidemia, and CAD with history of stent x 1. The patient states that he was in his usual state of health until midday 08/16/2023 when he began having nausea/vomiting and diarrhea. Then around 0400 he began having chest pain as well. He stated that the chest pain was 5/10. EMS was called. They performed an EKG which demonstrated down going T's inferiorly, and elevated ST segments laterally and antero-septally. Code STEMI was call en-route. The patient received ASA 324, NTG x 2 and fentanyl  en route. Upon arrival he received 4000 U of heparin  and went directly to the cath lab. Dr. Anner performed LHC which demonstrated no obvious culprit lesion. There was TIMI-3 flow down all vessels except maybe the apical LAD. LV gram demonstrated possible Takotsubo. CP was 2-3/10 with significant ST elevations, suggesting Takotsubo.    The patient has been returned to the ICU. He will receive Aggrastat  for 3 hours. Echocardiogram has been ordered. Cardiac enzymes will be followed. He has been started on lopressor  50 mg bid, irbesartan  75 mg daily and rosuvastatin  40 mg daily. He will also receive pain control. Possible repeat LHC in a couple of days per cardiology.   Assessment and Plan:  # Anterior STEMI  History of CAD s/p stents Patient presented with chest pain, nausea vomiting and diarrhea. Code STEMI was called, s/p cardiac cath, no obvious culprit lesion, patent RCA and pLCA stent, ostial LAD and ostial 1st diag was hazy  post-cath hematoma resolved. Hgb stable  echo showed LVEF 30-35%, anterior wall inferior apical akinesis, severe dilation of aortic root -  restarted on IV heparin  to complete 48 hours, can stop tomorrow PM Continue aspirin , metoprolol  and Crestor  Cardiology recommended repeat cardiac cath which is scheduled today.   # Chronic systolic CHF, compensated, no volume on LVEF 30 -35% Continue irbesartan  and metoprolol  Repeat TTE in 2 to 3 months  # Acute gastroenteritis Lactic acid elevated possible due to GI loss No signs of sepsis Currently patient is asymptomatic, GI symptoms have resolved Continue symptomatic treatment  # HTN, HLD Continue irbesartan , metoprolol  and statin LDL 45 below goal <70 Monitor BP and titrate medications accordingly  # Hypophosphatemia, Phos repleted.  Monitor electrolytes. Vitamin B12 level 208, goal >400: Started vitamin B12 1000 mcg IM injection daily during hospital stay, followed by oral supplement.  Follow-up PCP to repeat vitamin B12 level after 3 to 6 months.   Body mass index is 30.92 kg/m.  Interventions:  Diet: Heart healthy diet DVT Prophylaxis: Hep gtt   Advance goals of care discussion: Full code  Family Communication: family was not present at bedside, at the time of interview.  The pt provided permission to discuss medical plan with the family. Opportunity was given to ask question and all questions were answered satisfactorily.   Disposition:  Pt is from Home, admitted with anterior STEMI, still on Hep gtt x 48 hrs till 7/9 pm, which precludes a safe discharge. Discharge to Home, when stable and cleared by cardiology.  Subjective: No significant events overnight.  Patient denies any chest pain or palpitation, no nausea vomiting or diarrhea, no abdominal pain.   Physical Exam: General: NAD, lying  comfortably Appear in no distress, affect appropriate Eyes: PERRLA ENT: Oral Mucosa Clear, moist  Neck: no JVD,  Cardiovascular: S1 and S2 Present, no Murmur,  Respiratory: good respiratory effort, Bilateral Air entry equal and Decreased, no Crackles, no wheezes Abdomen:  Bowel Sound present, Soft and no tenderness,  Skin: no rashes Extremities: no Pedal edema, no calf tenderness Neurologic: without any new focal findings Gait not checked due to patient safety concerns  Vitals:   08/19/23 0734 08/19/23 1128 08/19/23 1347 08/19/23 1409  BP: 117/71 117/72 (!) 159/88   Pulse: 78 81    Resp: 16 18 18    Temp: 98.7 F (37.1 C) 99 F (37.2 C) 98.4 F (36.9 C)   TempSrc:   Oral   SpO2: 97% 99% 94% 98%  Weight:      Height:        Intake/Output Summary (Last 24 hours) at 08/19/2023 1446 Last data filed at 08/19/2023 1032 Gross per 24 hour  Intake 360 ml  Output 225 ml  Net 135 ml   Filed Weights   08/17/23 0534  Weight: 89.5 kg    Data Reviewed: I have personally reviewed and interpreted daily labs, tele strips, imagings as discussed above. I reviewed all nursing notes, pharmacy notes, vitals, pertinent old records I have discussed plan of care as described above with RN and patient/family.  CBC: Recent Labs  Lab 08/17/23 0824 08/18/23 0305 08/19/23 0400  WBC 14.8* 10.3 6.6  NEUTROABS 13.8*  --   --   HGB 15.0 13.8 13.3  HCT 45.9 41.5 40.4  MCV 91.3 91.4 90.6  PLT 203 155 143*   Basic Metabolic Panel: Recent Labs  Lab 08/17/23 0824 08/18/23 0618 08/19/23 0400  NA 140 137 138  K 4.0 3.5 3.5  CL 101 102 104  CO2 25 25 25   GLUCOSE 163* 134* 116*  BUN 19 17 17   CREATININE 0.92 0.87 0.85  CALCIUM  9.0 8.3* 8.4*  MG 1.9 1.7 1.9  PHOS  --  2.5 1.8*    Studies: No results found.  Scheduled Meds:  [MAR Hold] aspirin  EC  81 mg Oral Daily   [MAR Hold] Chlorhexidine  Gluconate Cloth  6 each Topical Nightly   [MAR Hold] cyanocobalamin   1,000 mcg Intramuscular Daily   Followed by   [FJM Hold] vitamin B-12  1,000 mcg Oral Daily   [MAR Hold] irbesartan   75 mg Oral Daily   [MAR Hold] metoprolol  succinate  12.5 mg Oral Daily   [MAR Hold] pantoprazole   40 mg Oral BID   [MAR Hold] rosuvastatin   40 mg Oral Daily   [MAR Hold] sodium  chloride flush  3 mL Intravenous Q12H   Continuous Infusions:  [START ON 08/20/2023] sodium chloride  3 mL/kg/hr (08/19/23 1350)   Followed by   NOREEN ON 08/20/2023] sodium chloride  1 mL/kg/hr (08/19/23 1350)   heparin  Stopped (08/19/23 1219)   potassium PHOSPHATE  IVPB (in mmol) 30 mmol (08/19/23 1010)   PRN Meds: [MAR Hold] acetaminophen , clopidogrel , fentaNYL , Heparin  (Porcine) in NaCl, Heparin  (Porcine) in NaCl, heparin  sodium (porcine), iohexol , lidocaine  (PF), midazolam , [MAR Hold] nitroGLYCERIN , [MAR Hold] ondansetron  (ZOFRAN ) IV, [MAR Hold] mouth rinse, [MAR Hold] sodium chloride  flush, verapamil   Time spent: 55 minutes  Author: ELVAN SOR. MD [facility] 08/19/2023 2:46 PM  To reach On-call, see care teams to locate the attending and reach out to them via [email or web address]. If 7PM-7AM, please contact night-coverage If you still have difficulty reaching the attending provider, please page the [facility] (Director on Call) for  [facility] on amion for assistance.

## 2023-08-19 NOTE — Interval H&P Note (Signed)
 History and Physical Interval Note:  08/19/2023 2:10 PM  Cody Herrera  has presented today for surgery, with the diagnosis of chest pain.  The various methods of treatment have been discussed with the patient and family. After consideration of risks, benefits and other options for treatment, the patient has consented to  Procedure(s): LEFT HEART CATH AND CORONARY ANGIOGRAPHY (N/A) as a surgical intervention.  The patient's history has been reviewed, patient examined, no change in status, stable for surgery.  I have reviewed the patient's chart and labs.  Questions were answered to the patient's satisfaction.     Timesha Cervantez

## 2023-08-19 NOTE — Progress Notes (Signed)
 PHARMACY - ANTICOAGULATION CONSULT NOTE  Pharmacy Consult for Heparin  Infusion Indication: chest pain/ACS  Patient Measurements: Height: 5' 7 (170.2 cm) Weight: 89.5 kg (197 lb 6.4 oz) IBW/kg (Calculated) : 66.1 HEPARIN  DW (KG): 84.7  Labs: Recent Labs    08/17/23 0824 08/17/23 0824 08/17/23 1024 08/18/23 0305 08/18/23 0618 08/18/23 1211 08/18/23 2107 08/19/23 0400  HGB 15.0  --   --  13.8  --   --   --  13.3  HCT 45.9  --   --  41.5  --   --   --  40.4  PLT 203  --   --  155  --   --   --  143*  HEPARINUNFRC  --    < >  --  0.22*  --  0.26* 0.38 0.43  CREATININE 0.92  --   --   --  0.87  --   --   --   TROPONINIHS 13,252*  --  23,394*  --   --   --   --   --    < > = values in this interval not displayed.    Estimated Creatinine Clearance: 77.1 mL/min (by C-G formula based on SCr of 0.87 mg/dL).   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 EKG demostrates significant ST elevations, patient was taken to the Cath lab for [facility] and no culprit lesions were found. Plan is to repeat LHC in a few days. Pharmacy consulted for Heparin  dosing. Patient did receive some heparin  in the Cath lab around 06:00.  Goal of Therapy:  Heparin  level 0.3-0.7 units/ml Monitor platelets by anticoagulation protocol: Yes  07/08 0305 HL 0.22, subtherapeutic 07/08 1211 HL 0.26, subtherapeutic 07/08 2107 HL 0.38, therapeutic x 1 07/09 0400 HL 0.43, therapeutic x 2   Plan: --Continue heparin  infusion at 1450 units/hour --Recheck HL daily w/ AM labs while therapeutic --CBC daily while on heparin   Rankin CANDIE Dills, PharmD, [facility] 08/19/2023 5:15 AM

## 2023-08-19 NOTE — Progress Notes (Signed)
 Heart Failure Stewardship Pharmacy Note  PCP: [name], MD PCP-Cardiologist: None  HPI: Cody Herrera is a 78 y.o. male with hypertension, hyperlipidemia, CAD who presented with chest pain, nausea, and vomiting. EKG showed anterior STEMI and patient was brought emergently to cath lab, however no culprit lesion was identified and initially Takotsubo was suspected. Ostial LAD and first diagonal areas noted to be hazy. This along with EKG and echocardiogram results are consistent with anterior STEMI with recannulization per Cardiology. On admission, BNP was 35.5, HS-troponin was 13,252 > 23,394, and lactic acid was 4.4. No chest x-ray noted. Cardiology planning relook cath today.  Pertinent cardiac history: LHC 03/2009 with 90% RCA stenosis, treated with DES. Stress test in 09/2017 with no significant ischemia, LVEF 64%. LHC 08/2023 showed patent RCA stent, mild nonobstructive disease of the LAD, and haziness of the ostial LAD and first diagonal suggesting potential plaque rupture with spontaneous recannulization. Echocardiogram 08/2023 showed LVEF of 30-35%, severe LV dilation, mild asymmetric hypertrophy of the basal/septal segments.  Pertinent Lab Values: Creatinine  Date Value Ref Range Status  12/30/2012 1.00 0.60 - 1.30 mg/dL Final   Creatinine, Ser  Date Value Ref Range Status  08/19/2023 0.85 0.61 - 1.24 mg/dL Final   BUN  Date Value Ref Range Status  08/19/2023 17 8 - 23 mg/dL Final  88/79/7985 21 (H) 7 - 18 mg/dL Final   Potassium  Date Value Ref Range Status  08/19/2023 3.5 3.5 - 5.1 mmol/L Final  12/30/2012 3.3 (L) 3.5 - 5.1 mmol/L Final   Sodium  Date Value Ref Range Status  08/19/2023 138 135 - 145 mmol/L Final  12/30/2012 140 136 - 145 mmol/L Final   B Natriuretic Peptide  Date Value Ref Range Status  08/17/2023 35.5 0.0 - 100.0 pg/mL Final    Comment:    Performed at [facility], [address] [place], [place] [address]    Magnesium  Date Value Ref Range Status  08/19/2023 1.9 1.7 - 2.4 mg/dL Final    Comment:    Performed at [facility], [address] [place], [place] [address]   Hgb A1c MFr Bld  Date Value Ref Range Status  12/11/2018 5.7 (H) 4.8 - 5.6 % Final    Comment:    (NOTE) Pre diabetes:          5.7%-6.4% Diabetes:              >6.4% Glycemic control for   <7.0% adults with diabetes    TSH  Date Value Ref Range Status  12/11/2018 8.169 (H) 0.350 - 4.500 uIU/mL Final    Comment:    Performed by a 3rd Generation assay with a functional sensitivity of <=0.01 uIU/mL. Performed at [facility], [address] [place], [place] [address]     Vital Signs:  Temp:  [98.1 F (36.7 C)-100.3 F (37.9 C)] 99 F (37.2 C) (07/09 1128) Pulse Rate:  [71-81] 81 (07/09 1128) Cardiac Rhythm: Normal sinus rhythm (07/09 0842) Resp:  [16-25] 18 (07/09 1128) BP: (96-122)/(58-78) 117/72 (07/09 1128) SpO2:  [94 %-99 %] 99 % (07/09 1128)  Intake/Output Summary (Last 24 hours) at 08/19/2023 1153 Last data filed at 08/19/2023 1032 Gross per 24 hour  Intake 400.96 ml  Output 225 ml  Net 175.96 ml    Current Heart Failure Medications:  Loop diuretic: none Beta-Blocker: metoprolol  tartrate 50 mg BID ACEI/ARB/ARNI: irbesartan  75 mg daily MRA: none SGLT2i: none Other: none  Prior to admission Heart  Failure Medications:  Loop diuretic: none Beta-Blocker: atenolol  100 mg daily ACEI/ARB/ARNI: lisinopril  40 mg daily MRA: none SGLT2i: none Other: none  Assessment: 1. Acute systolic heart failure (LVEF 30-35%)  , due to ICM. NYHA class II-III symptoms.  -Symptoms: Reports feeling improved today. Denies nausea or chest pain. Reports feeling back to baseline. -Volume: No LEE, cough, or orthopnea. On cath yesterday, LVEDP was 15. Not currently on diuretics.  -Hemodynamics: BP previously elevated, now normal. BB dose decreased. Low output symptoms appear improved. Lactic acid  repeat ordered today to ensure trending to normal. -BB: Currently on metoprolol  succinate 12.5 mg daily. Can consider titration at a later time. Symptoms of poor appetite and fatigue are improved today. -ACEI/ARB/ARNI: Continue irbesartan  75 mg daily.  -MRA: Consider adding spironolactone  12.5 mg daily with hypokalemia. -SGLT2i: Consider adding SGLT2i, may require a grant if started. -Recheck lactic acid this afternoon or tomorrow morning to ensure it has cleared.  Plan: 1) Medication changes recommended at this time: -Consider adding spironolactone  12.5 mg daily tomorrow   2) Patient assistance: -Entresto  copay is $177.64, Farxiga  copay is $151.05, Jardiance copay is $158.54   3) Education: - Patient has been educated on current HF medications and potential additions to HF medication regimen - Patient verbalizes understanding that over the next few months, these medication doses may change and more medications may be added to optimize HF regimen - Patient has been educated on basic disease state pathophysiology and goals of therapy  Medication Assistance / Insurance Benefits Check: Does the patient have prescription insurance?    Type of insurance plan:  Does the patient qualify for medication assistance through manufacturers or grants? Pending   Outpatient Pharmacy: Prior to admission outpatient pharmacy: [facility]      Please do not hesitate to reach out with questions or concerns,  Jaun Bash, PharmD, CPP, BCPS Heart Failure Pharmacist  Phone - [phone] 08/19/2023 11:53 AM

## 2023-08-20 ENCOUNTER — Encounter: Payer: Self-pay | Admitting: Cardiovascular Disease

## 2023-08-20 ENCOUNTER — Other Ambulatory Visit: Payer: Self-pay

## 2023-08-20 ENCOUNTER — Telehealth: Payer: Self-pay | Admitting: Emergency Medicine

## 2023-08-20 DIAGNOSIS — I2109 ST elevation (STEMI) myocardial infarction involving other coronary artery of anterior wall: Secondary | ICD-10-CM | POA: Diagnosis not present

## 2023-08-20 LAB — BASIC METABOLIC PANEL WITH GFR
Anion gap: 11 (ref 5–15)
BUN: 14 mg/dL (ref 8–23)
CO2: 25 mmol/L (ref 22–32)
Calcium: 8.7 mg/dL — ABNORMAL LOW (ref 8.9–10.3)
Chloride: 104 mmol/L (ref 98–111)
Creatinine, Ser: 0.47 mg/dL — ABNORMAL LOW (ref 0.61–1.24)
GFR, Estimated: >60 mL/min (ref >60)
Glucose, Bld: 116 mg/dL — ABNORMAL HIGH (ref 70–99)
Potassium: 3.8 mmol/L (ref 3.5–5.1)
Sodium: 140 mmol/L (ref 135–145)

## 2023-08-20 LAB — CBC
HCT: 39.9 % (ref 39.0–52.0)
Hemoglobin: 13.2 g/dL (ref 13.0–17.0)
MCH: 29.6 pg (ref 26.0–34.0)
MCHC: 33.1 g/dL (ref 30.0–36.0)
MCV: 89.5 fL (ref 80.0–100.0)
Platelets: 149 K/uL — ABNORMAL LOW (ref 150–400)
RBC: 4.46 MIL/uL (ref 4.22–5.81)
RDW: 13 % (ref 11.5–15.5)
WBC: 5.5 K/uL (ref 4.0–10.5)
nRBC: 0 % (ref 0.0–0.2)

## 2023-08-20 LAB — PHOSPHORUS: Phosphorus: 3 mg/dL (ref 2.5–4.6)

## 2023-08-20 LAB — MAGNESIUM: Magnesium: 2.1 mg/dL (ref 1.7–2.4)

## 2023-08-20 MED ORDER — ASPIRIN 81 MG PO TBEC
81.0000 mg | DELAYED_RELEASE_TABLET | Freq: Every day | ORAL | 12 refills | Status: AC
  Filled 2023-08-20: qty 30, 30d supply, fill #0

## 2023-08-20 MED ORDER — SPIRONOLACTONE 25 MG PO TABS
25.0000 mg | ORAL_TABLET | Freq: Every day | ORAL | 0 refills | Status: AC
Start: 2023-08-20 — End: ?
  Filled 2023-08-20: qty 30, 30d supply, fill #0

## 2023-08-20 MED ORDER — DAPAGLIFLOZIN PROPANEDIOL 10 MG PO TABS
10.0000 mg | ORAL_TABLET | Freq: Every day | ORAL | 0 refills | Status: AC
  Filled 2023-08-20: qty 30, 30d supply, fill #0

## 2023-08-20 MED ORDER — SACUBITRIL-VALSARTAN 49-51 MG PO TABS
1.0000 | ORAL_TABLET | Freq: Two times a day (BID) | ORAL | 0 refills | Status: AC
  Filled 2023-08-20: qty 60, 30d supply, fill #0

## 2023-08-20 MED ORDER — SACUBITRIL-VALSARTAN 49-51 MG PO TABS: 1.0000 | ORAL_TABLET | Freq: Two times a day (BID) | ORAL | Status: DC

## 2023-08-20 MED ORDER — CYANOCOBALAMIN 1000 MCG PO TABS
1000.0000 ug | ORAL_TABLET | Freq: Every day | ORAL | 2 refills | Status: AC
Start: 2023-08-26 — End: 2023-11-24
  Filled 2023-08-20: qty 30, 30d supply, fill #0

## 2023-08-20 MED ORDER — METOPROLOL SUCCINATE ER 50 MG PO TB24
50.0000 mg | ORAL_TABLET | Freq: Every day | ORAL | 0 refills | Status: AC
  Filled 2023-08-20: qty 30, 30d supply, fill #0

## 2023-08-20 MED ORDER — NITROGLYCERIN 0.4 MG SL SUBL
0.4000 mg | SUBLINGUAL_TABLET | SUBLINGUAL | 12 refills | Status: AC | PRN
Start: 2023-08-20 — End: ?
  Filled 2023-08-20: qty 25, 1d supply, fill #0

## 2023-08-20 MED ORDER — SPIRONOLACTONE 25 MG PO TABS: 25.0000 mg | ORAL_TABLET | Freq: Every day | ORAL | Status: DC

## 2023-08-20 MED ORDER — CLOPIDOGREL BISULFATE 75 MG PO TABS
75.0000 mg | ORAL_TABLET | Freq: Every day | ORAL | 0 refills | Status: DC
  Filled 2023-08-20: qty 30, 30d supply, fill #0

## 2023-08-20 MED ORDER — DAPAGLIFLOZIN PROPANEDIOL 10 MG PO TABS
10.0000 mg | ORAL_TABLET | Freq: Every day | ORAL | Status: DC
  Filled 2023-08-20: qty 1

## 2023-08-20 NOTE — Discharge Summary (Signed)
 [facility] Discharge Summary   Patient: Cody Herrera  PCP: [name], MD  Date of admission: 08/17/2023   Date of discharge:  08/20/2023     Discharge Diagnoses:  Principal Problem:   Acute ST elevation myocardial infarction (STEMI) of anterior wall [facility]) Active Problems:   Chest pain   Acute coronary syndrome (HCC)   Nausea vomiting and diarrhea   Essential hypertension   Hyperlipidemia   CAD S/P percutaneous coronary angioplasty   Acute HFrEF (heart failure with reduced ejection fraction) (HCC)   ST elevation myocardial infarction (STEMI) (HCC)   Ischemic cardiomyopathy   Admitted From: Home Disposition:  Home   Recommendations for Outpatient Follow-up:  F/u PCP in 1 wk F/u Cardio in 1 wk Follow up LABS/TEST:  CBC in 1 wk, vitamin B12 and 3 to 6 months   Follow-up Information     [facility]. Go on 08/24/2023.   Specialty: Cardiology Why: Hospital Follow-Up 08/24/23 @ 9:30 Please bring all medications to follow-up appointment [facility], [address] Second Floor Free Valet Parking at the door Contact information: [address] [place]  [address] [phone]        [name], MD Follow up on 08/25/2023.   Specialty: Family Medicine Why: Follow up: 08/25/2023 at 9:15. Please arrive 15 minutes early. Bring form of insurance card. Contact information: [address] [place] [address] [phone]                Diet recommendation: Cardiac diet  Activity: The patient is advised to gradually reintroduce usual activities, as tolerated  Discharge Condition: stable  Code Status: Full code   History of present illness: As per the H and P dictated on admission.  Hospital Course:  Cody Herrera is a 78 y.o. male with medical history significant for hypertension, hyperlipidemia, and CAD with history of  stent x 1. The patient states that he was in his usual state of health until midday 08/16/2023 when he began having nausea/vomiting and diarrhea. Then around 0400 he began having chest pain as well. He stated that the chest pain was 5/10. EMS was called. They performed an EKG which demonstrated down going T's inferiorly, and elevated ST segments laterally and antero-septally. Code STEMI was call en-route. The patient received ASA 324, NTG x 2 and fentanyl  en route. Upon arrival he received 4000 U of heparin  and went directly to the cath lab. Dr. Anner performed LHC which demonstrated no obvious culprit lesion. There was TIMI-3 flow down all vessels except maybe the apical LAD. LV gram demonstrated possible Takotsubo. CP was 2-3/10 with significant ST elevations, suggesting Takotsubo.    The patient has been returned to the ICU. He will receive Aggrastat  for 3 hours. Echocardiogram has been ordered. Cardiac enzymes will be followed. He has been started on lopressor  50 mg bid, irbesartan  75 mg daily and rosuvastatin  40 mg daily. He will also receive pain control. Possible repeat LHC in a couple of days per cardiology.     Assessment and Plan:   # Anterior STEMI  History of CAD s/p stents Patient presented with chest pain, nausea vomiting and diarrhea. Code STEMI was called, s/p cardiac cath, no obvious culprit lesion, patent RCA and pLCA stent, ostial LAD and ostial 1st diag was hazy  post-cath hematoma resolved. Hgb stable  echo showed LVEF 30-35%, anterior wall inferior apical akinesis, severe dilation of aortic root. S/p IV heparin   for 48 hours.  Continue aspirin , metoprolol  and Crestor  7/9 s/p repeat cardiac cath: Widely patent LAD stent with no significant obstructive disease. The hazy area in the ostial/proximal LAD resolved completely. Suspect there was a thrombus in that area that recanalized and resolved with antiplatelet medications/heparin .  Recommendations: No indication for PCI given no  obstructive disease at the present time and resolution of thrombus.  I loaded clopidogrel  600 mg once daily and will continue for at least 1 year and preferably longer. Patient was discharged on aspirin  and Plavix , beta-blocker and statin.    # Chronic systolic CHF, compensated, no volume on LVEF 30 -35%.  Patient was discharged on Toprol -XL 50 mg, Entresto  40-50 mg p.o. twice daily, Farxiga  10 mg, Aldactone  25 mg. Repeat TTE in 2 to 3 months   # Acute gastroenteritis Lactic acid elevated possible due to GI loss No signs of sepsis. Currently patient is asymptomatic, GI symptoms have resolved. S/p symptomatic treatment   # HTN, HLD: Continue Toprol -XL 50 mg, started Entresto , discontinued irbesartan .  Continued statin. LDL 45 below goal <70   # Hypophosphatemia, Phos repleted.  Monitor electrolytes.  Vitamin B12 level 208, goal >400: Started vitamin B12 1000 mcg IM injection daily during hospital stay, followed by oral supplement.  Follow-up PCP to repeat vitamin B12 level after 3 to 6 months.    Body mass index is 30.92 kg/m.  Nutrition Interventions:  - Patient was instructed, not to drive, operate heavy machinery, perform activities at heights, swimming or participation in water activities or provide baby sitting services while on Pain, Sleep and Anxiety Medications; until his outpatient Physician has advised to do so again.  - Also recommended to not to take more than prescribed Pain, Sleep and Anxiety Medications.  Patient was ambulatory without any assistance. On the day of the discharge the patient's vitals were stable, and no other acute medical condition were reported by patient. the patient was felt safe to be discharge at Home.  Consultants: Cardiologist  Procedures: Cardiac cath x 2 as above  Discharge Exam: General: Appear in no distress, no Rash; Oral Mucosa Clear, moist. Cardiovascular: S1 and S2 Present, no Murmur, Respiratory: normal respiratory effort, Bilateral  Air entry present and no Crackles, no wheezes Abdomen: Bowel Sound present, Soft and no tenderness, no hernia Extremities: no Pedal edema, no calf tenderness Neurology: alert and oriented to time, place, and person affect appropriate.  Filed Weights   08/17/23 0534  Weight: 89.5 kg   Vitals:   08/20/23 1100 08/20/23 1135  BP:  (!) 140/83  Pulse:  73  Resp: 20   Temp:  98.6 F (37 C)  SpO2:  99%    DISCHARGE MEDICATION: Allergies as of 08/20/2023   No Known Allergies      Medication List     STOP taking these medications    atenolol  100 MG tablet Commonly known as: TENORMIN    lisinopril  40 MG tablet Commonly known as: ZESTRIL        TAKE these medications    aspirin  EC 81 MG tablet Take 1 tablet (81 mg total) by mouth daily. Swallow whole. Start taking on: August 21, 2023   clopidogrel  75 MG tablet Commonly known as: PLAVIX  Take 1 tablet (75 mg total) by mouth daily with breakfast. Start taking on: August 21, 2023   cyanocobalamin  1000 MCG tablet Take 1 tablet (1,000 mcg total) by mouth daily. Start taking on: August 26, 2023   dapagliflozin  propanediol 10 MG Tabs tablet Commonly known as: FARXIGA   Take 1 tablet (10 mg total) by mouth daily.   metoprolol  succinate 50 MG 24 hr tablet Commonly known as: TOPROL -XL Take 1 tablet (50 mg total) by mouth daily. Take with or immediately following a meal. Start taking on: August 21, 2023   nitroGLYCERIN  0.4 MG SL tablet Commonly known as: NITROSTAT  Place 1 tablet (0.4 mg total) under the tongue every 5 (five) minutes x 3 doses as needed for chest pain.   rosuvastatin  40 MG tablet Commonly known as: CRESTOR  Take 40 mg by mouth daily.   sacubitril -valsartan  49-51 MG Commonly known as: ENTRESTO  Take 1 tablet by mouth 2 (two) times daily.   spironolactone  25 MG tablet Commonly known as: ALDACTONE  Take 1 tablet (25 mg total) by mouth daily.       No Known Allergies Discharge Instructions     AMB referral to  Phase II Cardiac Rehabilitation   Complete by: As directed    Diagnosis: STEMI   After initial evaluation and assessments completed: Virtual Based Care may be provided alone or in conjunction with Phase 2 Cardiac Rehab based on patient barriers.: Yes   Intensive Cardiac Rehabilitation (ICR) [facility] location only OR Traditional Cardiac Rehabilitation (TCR) *If criteria for ICR are not met will enroll in TCR [facility] only): Yes   Call MD for:  difficulty breathing, headache or visual disturbances   Complete by: As directed    Call MD for:  extreme fatigue   Complete by: As directed    Call MD for:  persistant dizziness or light-headedness   Complete by: As directed    Call MD for:  severe uncontrolled pain   Complete by: As directed    Call MD for:  temperature >100.4   Complete by: As directed    Diet - low sodium heart healthy   Complete by: As directed    Discharge instructions   Complete by: As directed    F/u PCP in 1 wk F/u Cardio in 1 wk   Increase activity slowly   Complete by: As directed        The results of significant diagnostics from this hospitalization (including imaging, microbiology, ancillary and laboratory) are listed below for reference.    Significant Diagnostic Studies: CARDIAC CATHETERIZATION Result Date: 08/19/2023   Prox LAD to Mid LAD lesion is 5% stenosed.   Ost LAD to Prox LAD lesion is 30% stenosed with 50% stenosed side branch in 1st Diag. Widely patent LAD stent with no significant obstructive disease.  The hazy area in the ostial/proximal LAD resolved completely.  Suspect there was a thrombus in that area that recanalized and resolved with antiplatelet medications/heparin . Recommendations: No indication for PCI given no obstructive disease at the present time and resolution of thrombus.  I loaded clopidogrel  600 mg once daily and will continue for at least 1 year and preferably longer. The patient can likely be discharged home tomorrow if he remains stable.    ECHOCARDIOGRAM COMPLETE Result Date: 08/17/2023    ECHOCARDIOGRAM REPORT   Patient Name:   Cody Herrera Date of Exam: 08/17/2023 Medical Rec #:  [id number]               Height:       67.0 in Accession #:    [id number]              Weight:       197.4 lb Date of Birth:  Jan 29, 1946  BSA:          2.011 m Patient Age:    77 years                BP:           152/91 mmHg Patient Gender: M                       HR:           95 bpm. Exam Location:  [facility] Procedure: 2D Echo, Color Doppler and Cardiac Doppler (Both Spectral and Color            Flow Doppler were utilized during procedure). Indications:     Acute myocardial infarction, unspecified I21.9  History:         Patient has no prior history of Echocardiogram examinations.                  Risk Factors:Hypertension and Dyslipidemia.  Sonographer:     Christopher Furnace Referring Phys:  [id number] DAVID W HARDING Diagnosing Phys: Maude Emmer MD IMPRESSIONS  1. Basal function preserved septal, apical distal anterior wall and inferior apical akinesis Findings consistent with LAD infarct vs Takatsubo DCM. Left ventricular ejection fraction, by estimation, is 30 to 35%. The left ventricle has moderately decreased function. The left ventricle has no regional wall motion abnormalities. The left ventricular internal cavity size was severely dilated. There is mild asymmetric left ventricular hypertrophy of the basal and septal segments. Left ventricular diastolic parameters were normal.  2. Right ventricular systolic function is normal. The right ventricular size is normal.  3. The mitral valve is normal in structure. No evidence of mitral valve regurgitation. No evidence of mitral stenosis.  4. The aortic valve is normal in structure. Aortic valve regurgitation is mild. No aortic stenosis is present.  5. Aortic dilatation noted. There is severe dilatation of the aortic root, measuring 48 mm. There is dilatation of the ascending aorta, measuring 48 mm.  6. The  inferior vena cava is normal in size with greater than 50% respiratory variability, suggesting right atrial pressure of 3 mmHg. FINDINGS  Left Ventricle: Basal function preserved septal, apical distal anterior wall and inferior apical akinesis Findings consistent with LAD infarct vs Takatsubo DCM. Left ventricular ejection fraction, by estimation, is 30 to 35%. The left ventricle has moderately decreased function. The left ventricle has no regional wall motion abnormalities. Strain was performed and the global longitudinal strain is indeterminate. The left ventricular internal cavity size was severely dilated. There is mild asymmetric left ventricular hypertrophy of the basal and septal segments. Left ventricular diastolic parameters were normal. Right Ventricle: The right ventricular size is normal. No increase in right ventricular wall thickness. Right ventricular systolic function is normal. Left Atrium: Left atrial size was normal in size. Right Atrium: Right atrial size was normal in size. Pericardium: There is no evidence of pericardial effusion. Mitral Valve: The mitral valve is normal in structure. No evidence of mitral valve regurgitation. No evidence of mitral valve stenosis. Tricuspid Valve: The tricuspid valve is normal in structure. Tricuspid valve regurgitation is mild . No evidence of tricuspid stenosis. Aortic Valve: The aortic valve is normal in structure. Aortic valve regurgitation is mild. No aortic stenosis is present. Aortic valve mean gradient measures 2.0 mmHg. Aortic valve peak gradient measures 3.9 mmHg. Aortic valve area, by VTI measures 4.67 cm. Pulmonic Valve: The pulmonic valve was normal in structure. Pulmonic valve regurgitation is not visualized. No  evidence of pulmonic stenosis. Aorta: Aortic dilatation noted. There is severe dilatation of the aortic root, measuring 48 mm. There is dilatation of the ascending aorta, measuring 48 mm. Venous: The inferior vena cava is normal in size  with greater than 50% respiratory variability, suggesting right atrial pressure of 3 mmHg. IAS/Shunts: No atrial level shunt detected by color flow Doppler. Additional Comments: 3D was performed not requiring image post processing on an independent workstation and was indeterminate.  LEFT VENTRICLE PLAX 2D LVIDd:         4.64 cm      Diastology LVIDs:         3.29 cm      LV e' medial:    5.51 cm/s LV PW:         1.31 cm      LV E/e' medial:  7.6 LV IVS:        1.38 cm      LV e' lateral:   4.84 cm/s LVOT diam:     2.30 cm      LV E/e' lateral: 8.6 LV SV:         75 LV SV Index:   37 LVOT Area:     4.15 cm  LV Volumes (MOD) LV vol d, MOD A2C: 84.7 ml LV vol d, MOD A4C: 128.0 ml LV vol s, MOD A2C: 42.5 ml LV vol s, MOD A4C: 107.0 ml LV SV MOD A2C:     42.2 ml LV SV MOD A4C:     128.0 ml LV SV MOD BP:      33.6 ml LEFT ATRIUM             Index       RIGHT ATRIUM          Index LA diam:        3.10 cm 1.54 cm/m  RA Area:     6.60 cm LA Vol (A2C):   15.3 ml 7.61 ml/m  RA Volume:   11.40 ml 5.67 ml/m LA Vol (A4C):   13.8 ml 6.86 ml/m LA Biplane Vol: 15.6 ml 7.76 ml/m  AORTIC VALVE AV Area (Vmax):    4.20 cm AV Area (Vmean):   4.04 cm AV Area (VTI):     4.67 cm AV Vmax:           98.80 cm/s AV Vmean:          65.200 cm/s AV VTI:            0.161 m AV Peak Grad:      3.9 mmHg AV Mean Grad:      2.0 mmHg LVOT Vmax:         99.80 cm/s LVOT Vmean:        63.400 cm/s LVOT VTI:          0.181 m LVOT/AV VTI ratio: 1.12  AORTA Ao Root diam: 2.45 cm MITRAL VALVE                TRICUSPID VALVE MV Area (PHT): 8.43 cm     TR Peak grad:   8.6 mmHg MV Decel Time: 90 msec      TR Vmax:        147.00 cm/s MV E velocity: 41.70 cm/s MV A velocity: 116.00 cm/s  SHUNTS MV E/A ratio:  0.36         Systemic VTI:  0.18 m  Systemic Diam: 2.30 cm Maude Emmer MD Electronically signed by Maude Emmer MD Signature Date/Time: 08/17/2023/11:10:51 AM    Final    CARDIAC CATHETERIZATION Addendum Date: 08/17/2023    Ost LAD to Prox LAD lesion is 20% stenosed with 40% stenosed side branch in 1st Diag.   Previously placed Prox LAD to Mid LAD stent is 5% stenosed.   Previously placed Prox RCA DES stent is  widely patent.   The left ventricular ejection fraction is 35-45% by visual estimate.  Apical ballooning/hypokinesis, with basal hypokinesis   There is no aortic valve stenosis. Dominance: Right Widely patent RCA and proximal LAD stent. No obvious culprit lesion besides potentially poor distal flow in the LAD. Nothing to explain the extensive ST elevations with minimal pain. ->  Cannot exclude potential hazy segment in the very ostial LAD but not occlusive. LV gram shows clear evidence of apical ballooning suggestive of Takotsubo with relative normal LVEDP   Check 2D echo-consider proximal relook angiogram in 1 to 2 days with intravascular imaging Follow cardiac enzymes Needed sure what medications he is taking, will start Lopressor  50 mg twice daily, and irbesartan  75 mg daily. Rosuvastatin  40 mg daily As needed narcotics for pain relief   Will run Aggrastat  for 3 hours and start aspirin  81 mg daily; would not initiate oral antiplatelet until relook cath.  Result Date: 08/17/2023 Images from the original result were not included.   Ost LAD to Prox LAD lesion is 20% stenosed with 40% stenosed side branch in 1st Diag.   Previously placed Prox LAD to Mid LAD stent is 5% stenosed.   Previously placed Prox RCA DES stent is  widely patent.   The left ventricular ejection fraction is 35-45% by visual estimate.  Apical ballooning/hypokinesis, with basal hypokinesis   There is no aortic valve stenosis.   Anticipated discharge date to be determined.   Check 2D echo-consider proximal relook angiogram in 1 to 2 days with intravascular imaging Follow cardiac enzymes Needed sure what medications he is taking, will start Lopressor  50 mg twice daily, and irbesartan  75 mg daily. Rosuvastatin  40 mg daily As needed narcotics for pain  relief   Will run Aggrastat  for 3 hours and start aspirin  81 mg daily; would not initiate oral antiplatelet until relook cath. Dominance: Right Widely patent RCA and proximal LAD stent. No obvious culprit lesion besides potentially poor distal flow in the LAD. Nothing to explain the extensive ST elevations with minimal pain. ->  Cannot exclude potential hazy segment in the very ostial LAD but not occlusive. LV gram shows clear evidence of apical ballooning suggestive of Takotsubo with relative normal LVEDP   Check 2D echo-consider proximal relook angiogram in 1 to 2 days with intravascular imaging Follow cardiac enzymes Needed sure what medications he is taking, will start Lopressor  50 mg twice daily, and irbesartan  75 mg daily. Rosuvastatin  40 mg daily As needed narcotics for pain relief   Will run Aggrastat  for 3 hours and start aspirin  81 mg daily; would not initiate oral antiplatelet until relook cath.    Microbiology: Recent Results (from the past 240 hours)  MRSA Next Gen by PCR, Nasal     Status: None   Collection Time: 08/17/23  5:20 AM   Specimen: Nasal Mucosa; Nasal Swab  Result Value Ref Range Status   MRSA by PCR Next Gen NOT DETECTED NOT DETECTED Final    Comment: (NOTE) The GeneXpert MRSA Assay (FDA approved for NASAL specimens only), is one component of a  comprehensive MRSA colonization surveillance program. It is not intended to diagnose MRSA infection nor to guide or monitor treatment for MRSA infections. Test performance is not FDA approved in patients less than 18 years old. Performed at [facility], [address] [place], [place] [address]      Labs: CBC: Recent Labs  Lab 08/17/23 [phone] 08/18/23 0305 08/19/23 0400 08/20/23 0357  WBC 14.8* 10.3 6.6 5.5  NEUTROABS 13.8*  --   --   --   HGB 15.0 13.8 13.3 13.2  HCT 45.9 41.5 40.4 39.9  MCV 91.3 91.4 90.6 89.5  PLT 203 155 143* 149*   Basic Metabolic Panel: Recent Labs  Lab 08/17/23 0824  08/18/23 0618 08/19/23 0400 08/20/23 0357  NA 140 137 138 140  K 4.0 3.5 3.5 3.8  CL 101 102 104 104  CO2 25 25 25 25   GLUCOSE 163* 134* 116* 116*  BUN 19 17 17 14   CREATININE 0.92 0.87 0.85 0.47*  CALCIUM  9.0 8.3* 8.4* 8.7*  MG 1.9 1.7 1.9 2.1  PHOS  --  2.5 1.8* 3.0   Liver Function Tests: Recent Labs  Lab 08/17/23 0824 08/19/23 0400  AST 63* 78*  ALT 28 28  ALKPHOS 68 53  BILITOT 0.8 0.9  PROT 6.9 6.1*  ALBUMIN 4.0 3.3*   No results for input(s): LIPASE, AMYLASE in the last 168 hours. No results for input(s): AMMONIA in the last 168 hours. Cardiac Enzymes: No results for input(s): CKTOTAL, CKMB, CKMBINDEX, TROPONINI in the last 168 hours. BNP (last 3 results) Recent Labs    08/17/23 0824  BNP 35.5   CBG: No results for input(s): GLUCAP in the last 168 hours.  Time spent: 35 minutes  Signed:  Elvan Sor  [facility] 08/20/2023 12:07 PM

## 2023-08-20 NOTE — Progress Notes (Signed)
 Progress Note  Patient Name: Riaan Toledo Date of Encounter: 08/20/2023 [facility] Cardiologist: None   Interval Summary    Re-look cath showed no significant disease. Cath site stable. No chest pain or SOB. He is ready to go home.   Vital Signs Vitals:   08/19/23 1922 08/19/23 2300 08/20/23 0727 08/20/23 0731  BP: 126/72 122/66 (!) 150/92 (!) 145/92  Pulse: 80 83 83 77  Resp: (!) 22 18 14    Temp: 98.7 F (37.1 C) 98.3 F (36.8 C) 98.2 F (36.8 C)   TempSrc: Oral  Oral   SpO2: 100% 97% 97%   Weight:      Height:        Intake/Output Summary (Last 24 hours) at 08/20/2023 1133 Last data filed at 08/20/2023 1022 Gross per 24 hour  Intake 360 ml  Output --  Net 360 ml      08/17/2023    5:34 AM 12/11/2018    3:45 AM 12/11/2018   12:17 AM  Last 3 Weights  Weight (lbs) 197 lb 6.4 oz 188 lb 0.8 oz 190 lb  Weight (kg) 89.54 kg 85.3 kg 86.183 kg      Telemetry/ECG  No tele - Personally Reviewed  Physical Exam  GEN: No acute distress.   Neck: No JVD Cardiac: RRR, no murmurs, rubs, or gallops.  Respiratory: Clear to auscultation bilaterally. GI: Soft, nontender, non-distended  MS: No edema  Assessment & Plan   Cardiac Studies:   Echo 08/17/23 1. Basal function preserved septal, apical distal anterior wall and  inferior apical akinesis Findings consistent with LAD infarct vs Takatsubo  DCM. Left ventricular ejection fraction, by estimation, is 30 to 35%. The  left ventricle has moderately  decreased function. The left ventricle has no regional wall motion  abnormalities. The left ventricular internal cavity size was severely  dilated. There is mild asymmetric left ventricular hypertrophy of the  basal and septal segments. Left ventricular  diastolic parameters were normal.   2. Right ventricular systolic function is normal. The right ventricular  size is normal.   3. The mitral valve is normal in structure. No evidence of mitral valve   regurgitation. No evidence of mitral stenosis.   4. The aortic valve is normal in structure. Aortic valve regurgitation is  mild. No aortic stenosis is present.   5. Aortic dilatation noted. There is severe dilatation of the aortic  root, measuring 48 mm. There is dilatation of the ascending aorta,  measuring 48 mm.   6. The inferior vena cava is normal in size with greater than 50%  respiratory variability, suggesting right atrial pressure of 3 mmHg.      Cardiac cath 08/17/23   Ost LAD to Prox LAD lesion is 20% stenosed with 40% stenosed side branch in 1st Diag.   Previously placed Prox LAD to Mid LAD stent is 5% stenosed.   Previously placed Prox RCA DES stent is  widely patent.   The left ventricular ejection fraction is 35-45% by visual estimate.  Apical ballooning/hypokinesis, with basal hypokinesis   There is no aortic valve stenosis.   Dominance: Right  Widely patent RCA and proximal LAD stent. No obvious culprit lesion besides potentially poor distal flow in the LAD. Nothing to explain the extensive ST elevations with minimal pain. ->  Cannot exclude potential hazy segment in the very ostial LAD but not occlusive. LV gram shows clear evidence of apical ballooning suggestive of Takotsubo with relative normal LVEDP  Check 2D echo-consider proximal relook angiogram in 1 to 2 days with intravascular imaging Follow cardiac enzymes Needed sure what medications he is taking, will start Lopressor  50 mg twice daily, and irbesartan  75 mg daily. Rosuvastatin  40 mg daily As needed narcotics for pain relief   Will run Aggrastat  for 3 hours and start aspirin  81 mg daily; would not initiate oral antiplatelet until relook cath.  LHC 08/19/23   Prox LAD to Mid LAD lesion is 5% stenosed.   Ost LAD to Prox LAD lesion is 30% stenosed with 50% stenosed side branch in 1st Diag.   Widely patent LAD stent with no significant obstructive disease.  The hazy area in the ostial/proximal LAD  resolved completely.  Suspect there was a thrombus in that area that recanalized and resolved with antiplatelet medications/heparin .   Recommendations: No indication for PCI given no obstructive disease at the present time and resolution of thrombus.  I loaded clopidogrel  600 mg once daily and will continue for at least 1 year and preferably longer. The patient can likely be discharged home tomorrow if he remains stable.   Assessment & Plan    Anterior STEMI - cath showed no obvious culprit lesion, patent RCA and pLCA stent, ostial LAD and ostial 1st diag was hazy- recommended re-look cath - re-look cath showed no significant obstructive disease, suspect thrombus resolved - post-cath hematoma resolved. Hgb stable - echo showed LVEF 30-35%, anterior wall inferior apical akinesis, severe dilation of aortic root - continue ASA 81mg  daily, Plavix  75mg  daily at least 1 year. Continue Toprol  50mg  daily, SL NTG and crestor  40mg  daily  HFrEF - echo this admission showed reduced LVEF 30-35% - MPI in 2019 showed EF 64% - not felt to be stress induced, suspected ICM - plan as above - continue toprol  50mg  daily, Farxiga  10mg  daily, Entresto  49-51mg BID, spiro 25mg  daily - repeat echo in 2-3 months   Lactic acidosis - no signs of shock - LA 4.4>2.5>1.7   HTN - blood pressures improved on current medications   HLD - LDL 54 - Crestor  40mg  daily      For questions or updates, please contact [facility] Please consult [email or web address] for contact info under       Signed, Oney Tatlock VEAR Fishman, PA-C

## 2023-08-20 NOTE — Progress Notes (Signed)
 Pt alert and oriented. VSS. Discharge instructions reviewed with pt. Pt verbalized understanding. Pt verbalized understanding of need to schedule/attend follow up appointments. Pt verbalized understanding of changes to medications. 2 IV's removed. IV sites WNL. Bruising to right radial cath site. No s/s of hematoma or bleeding. Printed discharge instructions given to pt. Pt taken to discharge lounge via wheelchair by RN. Pt verbalized understanding of need to wait for prescriptions to be delivered prior to leaving hospital.

## 2023-08-20 NOTE — Telephone Encounter (Signed)
-----   Message from Cadence VEAR Fishman sent at 08/20/2023 12:05 PM EDT ----- Regarding: hosp follow-up Patient needs [facility] hospital follow-up in 2 weeks.

## 2023-08-20 NOTE — Telephone Encounter (Signed)
 Patient contacted regarding discharge from [facility] on August 20, 2023.  Patient understands to follow up with provider Bernardino Bring, PA-C on September 03, 2023 at 10:30 am at [facility] [place]. Patient understands discharge instructions? yes  Patient understands medications and regiment? yes Patient understands to bring all medications to this visit? yes

## 2023-08-20 NOTE — Progress Notes (Signed)
 Heart Failure Stewardship Pharmacy Note  PCP: [name], MD PCP-Cardiologist: None  HPI: Cody Herrera is a 78 y.o. male with hypertension, hyperlipidemia, CAD who presented with chest pain, nausea, and vomiting. EKG showed anterior STEMI and patient was brought emergently to cath lab, however no culprit lesion was identified and initially Takotsubo was suspected. Ostial LAD and first diagonal areas noted to be hazy. This along with EKG and echocardiogram results are consistent with anterior STEMI with recannulization per Cardiology. On admission, BNP was 35.5, HS-troponin was 13,252 > 23,394, and lactic acid was 4.4. No chest x-ray noted. Repeat LHC performed 08/19/23 and confirmed no obstruction. Medical management with clopidogrel .  Pertinent cardiac history: LHC 03/2009 with 90% RCA stenosis, treated with DES. Stress test in 09/2017 with no significant ischemia, LVEF 64%. LHC 08/2023 showed patent RCA stent, mild nonobstructive disease of the LAD, and haziness of the ostial LAD and first diagonal suggesting potential plaque rupture with spontaneous recannulization. Echocardiogram 08/2023 showed LVEF of 30-35%, severe LV dilation, mild asymmetric hypertrophy of the basal/septal segments.  Pertinent Lab Values: Creatinine  Date Value Ref Range Status  12/30/2012 1.00 0.60 - 1.30 mg/dL Final   Creatinine, Ser  Date Value Ref Range Status  08/20/2023 0.47 (L) 0.61 - 1.24 mg/dL Final   BUN  Date Value Ref Range Status  08/20/2023 14 8 - 23 mg/dL Final  88/79/7985 21 (H) 7 - 18 mg/dL Final   Potassium  Date Value Ref Range Status  08/20/2023 3.8 3.5 - 5.1 mmol/L Final  12/30/2012 3.3 (L) 3.5 - 5.1 mmol/L Final   Sodium  Date Value Ref Range Status  08/20/2023 140 135 - 145 mmol/L Final  12/30/2012 140 136 - 145 mmol/L Final   B Natriuretic Peptide  Date Value Ref Range Status  08/17/2023 35.5 0.0 - 100.0 pg/mL Final    Comment:    Performed at [facility], [address] [place], [place] [address]   Magnesium  Date Value Ref Range Status  08/20/2023 2.1 1.7 - 2.4 mg/dL Final    Comment:    Performed at [facility], [address] [place], [place] [address]   Hgb A1c MFr Bld  Date Value Ref Range Status  12/11/2018 5.7 (H) 4.8 - 5.6 % Final    Comment:    (NOTE) Pre diabetes:          5.7%-6.4% Diabetes:              >6.4% Glycemic control for   <7.0% adults with diabetes    TSH  Date Value Ref Range Status  12/11/2018 8.169 (H) 0.350 - 4.500 uIU/mL Final    Comment:    Performed by a 3rd Generation assay with a functional sensitivity of <=0.01 uIU/mL. Performed at [facility], [address] [place], [place] [address]     Vital Signs:  Temp:  [98.2 F (36.8 C)-99 F (37.2 C)] 98.2 F (36.8 C) (07/10 0727) Pulse Rate:  [75-88] 77 (07/10 0731) Cardiac Rhythm: Normal sinus rhythm (07/09 1913) Resp:  [14-22] 14 (07/10 0727) BP: (117-159)/(66-139) 145/92 (07/10 0731) SpO2:  [89 %-100 %] 97 % (07/10 0727)  Intake/Output Summary (Last 24 hours) at 08/20/2023 0814 Last data filed at 08/19/2023 1032 Gross per 24 hour  Intake 120 ml  Output --  Net 120 ml    Current Heart Failure Medications:  Loop diuretic: none Beta-Blocker: metoprolol  tartrate 50 mg BID ACEI/ARB/ARNI: irbesartan  75 mg daily MRA: none SGLT2i: none  Other: none  Prior to admission Heart Failure Medications:  Loop diuretic: none Beta-Blocker: atenolol  100 mg daily ACEI/ARB/ARNI: lisinopril  40 mg daily MRA: none SGLT2i: none Other: none  Assessment: 1. Acute systolic heart failure (LVEF 30-35%)  , due to ICM. NYHA class II-III symptoms.  -Symptoms: Reports feeling fine today. Denies nausea or chest pain. Reports feeling back to baseline. -Volume: No LEE, cough, or orthopnea. On cath yesterday, LVEDP was 15. Not currently on diuretics.  -Hemodynamics: BP elevated. BB dose decreased. . Lactic acid yesterday was  normal. -BB: Currently on metoprolol  succinate 50 mg daily. Can consider titration at a later time. Symptoms of poor appetite and fatigue are improved today. -ACEI/ARB/ARNI: Continue irbesartan  75 mg daily. Can consider Entresto  outpatient if BP remains elevated. -MRA: Consider adding spironolactone  25 mg daily with hypokalemia. -SGLT2i: Consider adding SGLT2iSABRA Ferretti approved.   Plan: 1) Medication changes recommended at this time: -Consider adding spironolactone  25 mg daily  -Consider adding Farxiga  10 mg daily  2) Patient assistance: -Entresto  copay is $177.64, Farxiga  copay is $151.05, Jardiance copay is $158.54  -Patient attests to meeting income requirements for the [facility] grant. -[employer] grant applied and approved:  ID [id number] BIN [id number] PCN [id number] PC Group [id number] This will make copay of the above medications $0  3) Education: - Patient has been educated on current HF medications and potential additions to HF medication regimen - Patient verbalizes understanding that over the next few months, these medication doses may change and more medications may be added to optimize HF regimen - Patient has been educated on basic disease state pathophysiology and goals of therapy  Medication Assistance / Insurance Benefits Check: Does the patient have prescription insurance?    Type of insurance plan:  Does the patient qualify for medication assistance through manufacturers or grants? Pending   Outpatient Pharmacy: Prior to admission outpatient pharmacy: [facility]      Please do not hesitate to reach out with questions or concerns,  Jaun Bash, PharmD, CPP, BCPS Heart Failure Pharmacist  Phone - [phone] 08/20/2023 8:14 AM

## 2023-08-21 ENCOUNTER — Telehealth: Payer: Self-pay | Admitting: Family

## 2023-08-21 ENCOUNTER — Other Ambulatory Visit: Payer: Self-pay

## 2023-08-21 NOTE — Progress Notes (Unsigned)
 Advanced Heart Failure Clinic Note   Referring Physician: admission PCP: [name], MD Cardiologist: None   Chief Complaint: HF visit   HPI:  Cody Herrera is a 79 y/o male with a history of hypertension, hyperlipidemia, CAD, STEMI (07/25) & HfrEF.  Admitted 08/17/23 with chest pain, nausea, and vomiting. EKG showed anterior STEMI and patient was taken emergently to cath lab, however no culprit lesion was identified and initially Takotsubo was suspected. Ostial LAD and first diagonal areas noted to be hazy. This along with EKG and echocardiogram results are consistent with anterior STEMI with recannulization per Cardiology. On admission, BNP was 35.5, HS-troponin was 13,252 > 23,394, and lactic acid was 4.4. No chest x-ray noted. Repeat LHC performed 08/19/23 and confirmed no obstruction. Echocardiogram 08/17/23: LVEF of 30-35%, severe LV dilation, mild asymmetric hypertrophy of the basal/septal segments.   He presents today with a chief complaint of his initial HF visit. Has no complaint of anything at this time. Currently wearing a right knee brace for support but denies any pain in the knee.   Review of Systems: [y] = yes, [ ]  = no   General: Weight gain [ ] ; Weight loss [ ] ; Anorexia [ ] ; Fatigue [ ] ; Fever [ ] ; Chills [ ] ; Weakness [ ]   Cardiac: Chest pain/pressure [ ] ; Resting SOB [ ] ; Exertional SOB [ ] ; Orthopnea [ ] ; Pedal Edema [ ] ; Palpitations [ ] ; Syncope [ ] ; Presyncope [ ] ; Paroxysmal nocturnal dyspnea[ ]   Pulmonary: Cough [ ] ; Wheezing[ ] ; Hemoptysis[ ] ; Sputum [ ] ; Snoring [ ]   GI: Vomiting[ ] ; Dysphagia[ ] ; Melena[ ] ; Hematochezia [ ] ; Heartburn[ ] ; Abdominal pain [ ] ; Constipation [ ] ; Diarrhea [ ] ; BRBPR [ ]   GU: Hematuria[ ] ; Dysuria [ ] ; Nocturia[ ]   Vascular: Pain in legs with walking [ ] ; Pain in feet with lying flat [ ] ; Non-healing sores [ ] ; Stroke [ ] ; TIA [ ] ; Slurred speech [ ] ;  Neuro: Headaches[ ] ; Vertigo[ ] ; Seizures[ ] ; Paresthesias[ ] ;Blurred  vision [ ] ; Diplopia [ ] ; Vision changes [ ]   Ortho/Skin: Arthritis [ ] ; Joint pain [ ] ; Muscle pain [ ] ; Joint swelling [ ] ; Back Pain [ ] ; Rash [ ]   Psych: Depression[ ] ; Anxiety[ ]   Heme: Bleeding problems [ ] ; Clotting disorders [ ] ; Anemia [ ]   Endocrine: Diabetes [ ] ; Thyroid dysfunction[ ]    Past Medical History:  Diagnosis Date   Actinic keratosis    Dysplastic nevus 07/05/2020   left mid back, severe atypia, close to margin, clear 10/01/22   Hypercholesteremia    Hyperlipidemia    Hypertension     Current Outpatient Medications  Medication Sig Dispense Refill   aspirin  EC 81 MG tablet Take 1 tablet (81 mg total) by mouth daily. Swallow whole. 30 tablet 12   clopidogrel  (PLAVIX ) 75 MG tablet Take 1 tablet (75 mg total) by mouth daily with breakfast. 30 tablet 0   [START ON 08/26/2023] cyanocobalamin  1000 MCG tablet Take 1 tablet (1,000 mcg total) by mouth daily. 30 tablet 2   dapagliflozin  propanediol (FARXIGA ) 10 MG TABS tablet Take 1 tablet (10 mg total) by mouth daily. 30 tablet 0   metoprolol  succinate (TOPROL -XL) 50 MG 24 hr tablet Take 1 tablet (50 mg total) by mouth daily. Take with or immediately following a meal. 30 tablet 0   nitroGLYCERIN  (NITROSTAT ) 0.4 MG SL tablet Place 1 tablet (0.4 mg total) under the tongue every 5 (five) minutes x 3 doses as needed for  chest pain. 25 tablet 12   rosuvastatin  (CRESTOR ) 40 MG tablet Take 40 mg by mouth daily.     sacubitril -valsartan  (ENTRESTO ) 49-51 MG Take 1 tablet by mouth 2 (two) times daily. 60 tablet 0   spironolactone  (ALDACTONE ) 25 MG tablet Take 1 tablet (25 mg total) by mouth daily. 30 tablet 0   No current facility-administered medications for this visit.    No Known Allergies    Social History   Socioeconomic History   Marital status: Single    Spouse name: Not on file   Number of children: 2   Years of education: Not on file   Highest education level: Not on file  Occupational History   Not on file   Tobacco Use   Smoking status: Never   Smokeless tobacco: Never  Substance and Sexual Activity   Alcohol use: Yes    Comment: ocassinally   Drug use: Never   Sexual activity: Not on file  Other Topics Concern   Not on file  Social History Narrative   Not on file   Social Drivers of Health   Financial Resource Strain: Not on file  Food Insecurity: No Food Insecurity (08/19/2023)   Hunger Vital Sign    Worried About Running Out of Food in the Last Year: Never true    Ran Out of Food in the Last Year: Never true  Transportation Needs: No Transportation Needs (08/19/2023)   PRAPARE - Administrator, Civil Service (Medical): No    Lack of Transportation (Non-Medical): No  Physical Activity: Not on file  Stress: Not on file  Social Connections: Moderately Integrated (08/17/2023)   Social Connection and Isolation Panel    Frequency of Communication with Friends and Family: More than three times a week    Frequency of Social Gatherings with Friends and Family: More than three times a week    Attends Religious Services: More than 4 times per year    Active Member of [employer] or Organizations: Yes    Attends Banker Meetings: More than 4 times per year    Marital Status: Divorced  Intimate Partner Violence: Not At Risk (08/17/2023)   Humiliation, Afraid, Rape, and Kick questionnaire    Fear of Current or Ex-Partner: No    Emotionally Abused: No    Physically Abused: No    Sexually Abused: No      Family History  Problem Relation Age of Onset   Heart attack Mother    Heart attack Father    Vitals:   08/24/23 0923  BP: 128/88  Pulse: 86  SpO2: 98%  Weight: 192 lb 4 oz (87.2 kg)  Height: 5' 8 (1.727 m)   Wt Readings from Last 3 Encounters:  08/24/23 192 lb 4 oz (87.2 kg)  08/17/23 197 lb 6.4 oz (89.5 kg)  12/11/18 188 lb 0.8 oz (85.3 kg)   Lab Results  Component Value Date   CREATININE 0.86 08/24/2023   CREATININE 0.47 (L) 08/20/2023   CREATININE  0.85 08/19/2023    PHYSICAL EXAM:  General: Well appearing. No resp difficulty HEENT: normal Neck: supple, no JVD Cor: Regular rhythm, rate. No rubs, gallops or murmurs Lungs: clear Abdomen: soft, nontender, nondistended. Extremities: no clubbing, rash, edema. Knee brace noted on right knee. Extensive bruising noted from right AC to right inner forearm Neuro: alert & oriented X 3. Moves all 4 extremities w/o difficulty. Affect pleasant. + right radial pulse, right fingers/ hand warm  ECG: not done  ASSESSMENT & PLAN:  1: NICM with reduced ejection fraction- - STEMI 07/25 - LHC 08/17/23 Ost LAD to Prox LAD lesion is 20% stenosed with 40% stenosed side branch in 1st Diag. - repeat LHC 08/19/23 showed nonobstructive CAD - NYHA Herrera I - euvolemic - has scales but says that he has no intention of weighing daily - Echo 08/17/23: LVEF of 30-35%, severe LV dilation, mild asymmetric hypertrophy of the basal/septal segments.  - continue farxiga  10mg  daily - continue metoprolol  succinate 50mg  daily; consider titrating at future visits but he was just started on these meds during recent admission - continue entresto  49/51mg  BID - continue spironolactone  25mg  daily - BNP 08/17/23 was 35.5  2: HTN- - BP 128/88 - saw PCP (Olmedo) 08/24 - BMET 08/20/23 reviewed: sodium 140, potassium 3.8, creatinine 0.47 & GFR >60 - BMET today  3: CAD- - LHC 08/17/23:    Ost LAD to Prox LAD lesion is 20% stenosed with 40% stenosed side branch in 1st Diag.   Previously placed Prox LAD to Mid LAD stent is 5% stenosed.   Previously placed Prox RCA DES stent is  widely patent.   The left ventricular ejection fraction is 35-45% by visual estimate.  Apical ballooning/hypokinesis, with basal hypokinesis   There is no aortic valve stenosis. - Repeat LHC 08/19/23 and confirmed no obstruction - to see cardiology (Dunn) in ~ 10 days  4: Hyperlipidemia- - LDL 08/17/23 was 45 - lipo (a) 08/18/23 was 49.3 - continue  rosuvastatin  40mg  daily   Return in 6 weeks, sooner if needed.   Cody DELENA Class, FNP 08/21/23

## 2023-08-21 NOTE — Telephone Encounter (Signed)
 Called to confirm/remind patient of their appointment at the [facility] on 08/24/23.   Appointment:   [x] Confirmed  [] Left mess   [] No answer/No voice mail  [] VM Full/unable to leave message  [] Phone not in service  Patient reminded to bring all medications and/or complete list.  Confirmed patient has transportation. Gave directions, instructed to utilize valet parking.

## 2023-08-24 ENCOUNTER — Other Ambulatory Visit
Admission: RE | Admit: 2023-08-24 | Discharge: 2023-08-24 | Disposition: A | Discharge status: Home / Self Care | Source: Ambulatory Visit | Attending: Family | Admitting: Family

## 2023-08-24 ENCOUNTER — Ambulatory Visit: Payer: Self-pay | Admitting: Family

## 2023-08-24 ENCOUNTER — Encounter: Payer: Self-pay | Admitting: Family

## 2023-08-24 ENCOUNTER — Ambulatory Visit: Admitting: Family

## 2023-08-24 VITALS — BP 128/88 | HR 86 | Ht 68.0 in | Wt 192.2 lb

## 2023-08-24 DIAGNOSIS — I2510 Atherosclerotic heart disease of native coronary artery without angina pectoris: Secondary | ICD-10-CM

## 2023-08-24 DIAGNOSIS — I5021 Acute systolic (congestive) heart failure: Secondary | ICD-10-CM | POA: Insufficient documentation

## 2023-08-24 DIAGNOSIS — I10 Essential (primary) hypertension: Secondary | ICD-10-CM

## 2023-08-24 DIAGNOSIS — E782 Mixed hyperlipidemia: Secondary | ICD-10-CM | POA: Diagnosis not present

## 2023-08-24 DIAGNOSIS — I5022 Chronic systolic (congestive) heart failure: Secondary | ICD-10-CM | POA: Diagnosis not present

## 2023-08-24 LAB — BASIC METABOLIC PANEL WITH GFR
Anion gap: 8 (ref 5–15)
BUN: 18 mg/dL (ref 8–23)
CO2: 24 mmol/L (ref 22–32)
Calcium: 9.1 mg/dL (ref 8.9–10.3)
Chloride: 109 mmol/L (ref 98–111)
Creatinine, Ser: 0.86 mg/dL (ref 0.61–1.24)
GFR, Estimated: >60 mL/min (ref >60)
Glucose, Bld: 131 mg/dL — ABNORMAL HIGH (ref 70–99)
Potassium: 4.4 mmol/L (ref 3.5–5.1)
Sodium: 141 mmol/L (ref 135–145)

## 2023-08-24 NOTE — Patient Instructions (Signed)
 Medication Changes:  No medication changes.   Lab Work:  Go over to the [facility]. Go pass the gift shop and have your blood work completed.  We will only call you if the results are abnormal or if the provider would like to make medication changes.   Special Instructions // Education:  It was nice to meet you today!   Follow-Up in: 6 weeks with Ellouise Class, FNP.  At the [facility], you and your health needs are our priority. We have a designated team specialized in the treatment of Heart Failure. This Care Team includes your primary Heart Failure Specialized Cardiologist (physician), Advanced Practice Providers (APPs- Physician Assistants and Nurse Practitioners), and Pharmacist who all work together to provide you with the care you need, when you need it.   You may see any of the following providers on your designated Care Team at your next follow up:  Dr. Toribio Fuel Dr. Ezra Shuck Dr. Ria Commander Dr. Odis Brownie Ellouise Class, FNP Jaun Bash, RPH-CPP  Please be sure to bring in all your medications bottles to every appointment.   Need to Contact Us :  If you have any questions or concerns before your next appointment please send us  a message through [place] or call our office at [phone].    TO LEAVE A MESSAGE FOR THE NURSE SELECT OPTION 2, PLEASE LEAVE A MESSAGE INCLUDING: YOUR NAME DATE OF BIRTH CALL BACK NUMBER REASON FOR CALL**this is important as we prioritize the call backs  YOU WILL RECEIVE A CALL BACK THE SAME DAY AS LONG AS YOU CALL BEFORE 4:00 PM

## 2023-09-02 NOTE — Progress Notes (Unsigned)
 Cardiology Office Note    Date:  09/03/2023   ID:  Jaeson, Molstad 11/24/1945, MRN [id number]  PCP:  [name], MD  Cardiologist: Alm Clay, MD Electrophysiologist:  None   Chief Complaint: Hospital follow up  History of Present Illness:   Cody Herrera is a 78 y.o. male with history of CAD, hypertension, and hyperlipidemia who is being seen for hospital follow-up.  Patient presented to [facility] on 08/17/2023 with chest pain awakening him from sleep with associated nausea and vomiting.  He was found to have anterior STEMI with troponin greater than 23,000.  He was taken for emergent cardiac cath which did not reveal any obvious culprit.  On further review, there was concern for a haziness that could represent intracoronary thrombus in the ostial/proximal LAD.  Patient was treated with tirofiban , this was complicated by right forearm hematoma.  Echo showed EF 30 to 35% with anterior wall and inferior apical akinesis and severe dilation of the aortic root.  He underwent relook cath which showed no significant obstructive disease, suspected thrombus resolved.  DAPT was recommended for at least 1 year. GDMT was titrated prior to discharge on 7/10.  Patient was recently seen by [facility] HF clinic 7/14 and overall doing well from a cardiac perspective.  No medication changes were made.  Patient presents for follow-up today overall doing well from a cardiac perspective.  He is without symptoms of angina or cardiac decompensation.  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  He occasionally has to stop due to the heat, but never due to shortness of breath or chest pain.  He takes his blood pressure at home occasionally but does not record the readings.  He denies chest pain, shortness of breath, lightheadedness, dizziness, lower extremity swelling, and orthopnea.  He denies bleeding and hematochezia.  He reports remote history of GI bleed and  stopped taking his Plavix  due to this despite no current symptoms.  States that he does not want to go through issues with the bleeding again.  We had a long discussion regarding the benefits/risks of DAPT.  Labs independently reviewed: 08/24/2023-NA 141, K4.4, BUN 18, creatinine 0.86 08/17/2023- TC 98, TG 80, HDL 37, LDL 45  Objective   Past Medical History:  Diagnosis Date   Actinic keratosis    Dysplastic nevus 07/05/2020   left mid back, severe atypia, close to margin, clear 10/01/22   Hypercholesteremia    Hyperlipidemia    Hypertension     Current Medications: Current Meds  Medication Sig   aspirin  EC 81 MG tablet Take 1 tablet (81 mg total) by mouth daily. Swallow whole.   cyanocobalamin  1000 MCG tablet Take 1 tablet (1,000 mcg total) by mouth daily.   dapagliflozin  propanediol (FARXIGA ) 10 MG TABS tablet Take 1 tablet (10 mg total) by mouth daily.   metoprolol  succinate (TOPROL -XL) 50 MG 24 hr tablet Take 1 tablet (50 mg total) by mouth daily. Take with or immediately following a meal.   nitroGLYCERIN  (NITROSTAT ) 0.4 MG SL tablet Place 1 tablet (0.4 mg total) under the tongue every 5 (five) minutes x 3 doses as needed for chest pain.   rosuvastatin  (CRESTOR ) 40 MG tablet Take 40 mg by mouth daily.   sacubitril -valsartan  (ENTRESTO ) 49-51 MG Take 1 tablet by mouth 2 (two) times daily.   spironolactone  (ALDACTONE ) 25 MG tablet Take 1 tablet (25 mg total) by mouth daily.    Allergies:   Prednisone   Social History   Socioeconomic History   Marital status: Single    Spouse name: Not on file   Number of children: 2   Years of education: Not on file   Highest education level: Not on file  Occupational History   Not on file  Tobacco Use   Smoking status: Never   Smokeless tobacco: Never  Substance and Sexual Activity   Alcohol use: Yes    Comment: ocassinally   Drug use: Never   Sexual activity: Not on file  Other Topics Concern   Not on file  Social History  Narrative   Not on file   Social Drivers of Health   Financial Resource Strain: Not on file  Food Insecurity: No Food Insecurity (08/19/2023)   Hunger Vital Sign    Worried About Running Out of Food in the Last Year: Never true    Ran Out of Food in the Last Year: Never true  Transportation Needs: No Transportation Needs (08/19/2023)   PRAPARE - Administrator, Civil Service (Medical): No    Lack of Transportation (Non-Medical): No  Physical Activity: Not on file  Stress: Not on file  Social Connections: Moderately Integrated (08/17/2023)   Social Connection and Isolation Panel    Frequency of Communication with Friends and Family: More than three times a week    Frequency of Social Gatherings with Friends and Family: More than three times a week    Attends Religious Services: More than 4 times per year    Active Member of [employer] or Organizations: Yes    Attends Engineer, structural: More than 4 times per year    Marital Status: Divorced     Family History:  The patient's family history includes Heart attack in his father and mother.  ROS:   12-point review of systems is negative unless otherwise noted in the HPI.  EKGs/Other Studies Reviewed:    Studies reviewed were summarized above. The additional studies were reviewed today:  08/19/2023 Coronary angiography   Prox LAD to Mid LAD lesion is 5% stenosed.   Ost LAD to Prox LAD lesion is 30% stenosed with 50% stenosed side branch in 1st Diag. Widely patent LAD stent with no significant obstructive disease.  The hazy area in the ostial/proximal LAD resolved completely.  Suspect there was a thrombus in that area that recanalized and resolved with antiplatelet medications/heparin .  08/17/2023 Echo complete 1. Basal function preserved septal, apical distal anterior wall and inferior apical akinesis Findings consistent with LAD infarct vs Takatsubo DCM. Left ventricular ejection fraction, by estimation, is 30 to 35%. The  left ventricle has moderately decreased function. The left ventricle has no regional wall motion abnormalities. The left ventricular internal cavity size was severely dilated. There is mild asymmetric left ventricular hypertrophy of the basal and septal segments. Left ventricular diastolic parameters were normal.   2. Right ventricular systolic function is normal. The right ventricular size is normal.   3. The mitral valve is normal in structure. No evidence of mitral valve regurgitation. No evidence of mitral stenosis.   4. The aortic valve is normal in structure. Aortic valve regurgitation is mild. No aortic stenosis is present.   5. Aortic dilatation noted. There is severe dilatation of the aortic root, measuring 48 mm. There is dilatation of the ascending aorta, measuring 48 mm.   6. The inferior vena cava is normal in size with greater than 50% respiratory variability, suggesting right atrial pressure of 3 mmHg.  08/17/2023 LHC   Ost LAD to Prox LAD lesion is 20% stenosed with 40% stenosed side branch in 1st Diag.   Previously placed Prox LAD to Mid LAD stent is 5% stenosed.   Previously placed Prox RCA DES stent is  widely patent.   The left ventricular ejection fraction is 35-45% by visual estimate.  Apical ballooning/hypokinesis, with basal hypokinesis   There is no aortic valve stenosis.   Dominance: Right   EKG:  EKG personally reviewed by me today EKG Interpretation Date/Time:  Thursday September 03 2023 10:23:46 EDT Ventricular Rate:  79 PR Interval:  220 QRS Duration:  114 QT Interval:  428 QTC Calculation: 490 R Axis:   24  Text Interpretation: Sinus rhythm with 1st degree A-V block Incomplete left bundle branch block Minimal voltage criteria for LVH, may be normal variant ( Cornell product ) ST & Marked T wave abnormality, consider anterolateral ischemia Prolonged QT When compared with ECG of 17-Aug-2023 05:26, PREVIOUS ECG IS PRESENT Confirmed by Lorene Sinclair ([id number]) on 09/03/2023  10:29:37 AM  PHYSICAL EXAM:    VS:  BP 130/78   Pulse 79   Ht 5' 8 (1.727 m)   Wt 189 lb 12.8 oz (86.1 kg)   SpO2 95%   BMI 28.86 kg/m   BMI: Body mass index is 28.86 kg/m.  Physical Exam Vitals and nursing note reviewed.  Constitutional:      Appearance: Normal appearance.  Cardiovascular:     Rate and Rhythm: Normal rate and regular rhythm.     Heart sounds: No murmur heard.    No gallop.  Pulmonary:     Effort: Pulmonary effort is normal.     Breath sounds: Normal breath sounds. No wheezing or rales.  Musculoskeletal:     Right lower leg: No edema.     Left lower leg: No edema.  Skin:    General: Skin is warm and dry.  Neurological:     General: No focal deficit present.     Mental Status: He is alert and oriented to person, place, and time. Mental status is at baseline.  Psychiatric:        Mood and Affect: Mood normal.        Behavior: Behavior normal.    Wt Readings from Last 3 Encounters:  09/03/23 189 lb 12.8 oz (86.1 kg)  08/24/23 192 lb 4 oz (87.2 kg)  08/17/23 197 lb 6.4 oz (89.5 kg)       ASSESSMENT & PLAN:   Coronary artery disease with recent STEMI - Recent hospitalization 7/7 for anterior STEMI with no culprit identified on initial heart cath.  Echo showed EF 30 to 35% with large region of hypokinesis anterior wall extending around the apical region.  Relook cath showed no significant obstructive disease, suspect thrombus resolved.  Today without symptoms of angina or cardiac decompensation.  Patient self discontinued Plavix  due to remote history of GI bleed several years ago despite no current symptoms.  Long discussion regarding risks/benefits of DAPT therapy.  At this time, patient would prefer not to take Plavix .  He understands the risks.  He is continued on aspirin , metoprolol  succinate, and rosuvastatin .  Check CMP and CBC today.  Referral to cardiac rehab placed.  HFrEF - Recent hospitalization detailed above with echo showing EF 30 to 35%.   Patient appears euvolemic on exam today.  Recommended titration of Entresto  given slightly elevated BP.  Patient would like to keep medicines the same at this time.  Continue Farxiga   10 mg daily, metoprolol  succinate 50 mg daily, Entresto  49-51 mg twice daily, and spironolactone  25 mg daily.  Recommend repeat echo in 2 to 3 months.  Hypertension - Blood pressure reasonably well-controlled.  Recommended increasing Entresto  as above.  However, patient preferred to keep medications the same.  Hyperlipidemia - Lipid panel during admission showed LDL 45, at goal.  Continue rosuvastatin  40 mg daily.   Disposition: F/u with Dr. Anner or an APP in 1 month.   Medication Adjustments/Labs and Tests Ordered: Current medicines are reviewed at length with the patient today.  Concerns regarding medicines are outlined above. Medication changes, Labs and Tests ordered today are summarized above and listed in the Patient Instructions accessible in Encounters.   Bonney Lesley Maffucci, PA-C 09/03/2023 11:55 AM     [facility] [place] [address] [place], [place] [address] [phone]

## 2023-09-03 ENCOUNTER — Encounter: Payer: Self-pay | Admitting: Physician Assistant

## 2023-09-03 ENCOUNTER — Ambulatory Visit: Attending: Physician Assistant | Admitting: Physician Assistant

## 2023-09-03 VITALS — BP 130/78 | HR 79 | Ht 68.0 in | Wt 189.8 lb

## 2023-09-03 DIAGNOSIS — I10 Essential (primary) hypertension: Secondary | ICD-10-CM

## 2023-09-03 DIAGNOSIS — I2510 Atherosclerotic heart disease of native coronary artery without angina pectoris: Secondary | ICD-10-CM | POA: Diagnosis not present

## 2023-09-03 DIAGNOSIS — I5020 Unspecified systolic (congestive) heart failure: Secondary | ICD-10-CM | POA: Diagnosis not present

## 2023-09-03 DIAGNOSIS — Z79899 Other long term (current) drug therapy: Secondary | ICD-10-CM | POA: Diagnosis not present

## 2023-09-03 DIAGNOSIS — E782 Mixed hyperlipidemia: Secondary | ICD-10-CM

## 2023-09-03 NOTE — Patient Instructions (Signed)
 Medication Instructions:  Your physician recommends that you continue on your current medications as directed. Please refer to the Current Medication list given to you today.   *If you need a refill on your cardiac medications before your next appointment, please call your pharmacy*  Lab Work: Your provider would like for you to have following labs drawn today CMeT and CBC.   If you have labs (blood work) drawn today and your tests are completely normal, you will receive your results only by: MyChart Message (if you have MyChart) OR A paper copy in the mail If you have any lab test that is abnormal or we need to change your treatment, we will call you to review the results.  Follow-Up: At [facility], you and your health needs are our priority.  As part of our continuing mission to provide you with exceptional heart care, our providers are all part of one team.  This team includes your primary Cardiologist (physician) and Advanced Practice Providers or APPs (Physician Assistants and Nurse Practitioners) who all work together to provide you with the care you need, when you need it.  Your next appointment:   1 month(s)  Provider:   You may see one of the following Advanced Practice Providers on your designated Care Team:   Lonni Meager, NP Lesley Maffucci, PA-C Bernardino Bring, PA-C Cadence Fountain, PA-C Tylene Lunch, NP Barnie Hila, NP   We recommend signing up for the patient portal called MyChart.  Sign up information is provided on this After Visit Summary.  MyChart is used to connect with patients for Virtual Visits (Telemedicine).  Patients are able to view lab/test results, encounter notes, upcoming appointments, etc.  Non-urgent messages can be sent to your provider as well.   To learn more about what you can do with MyChart, go to [email or web address].   Other Instructions Referred to cardiac rehab

## 2023-09-04 LAB — CBC
Hematocrit: 45.6 % (ref 37.5–51.0)
Hemoglobin: 14.7 g/dL (ref 13.0–17.7)
MCH: 30.1 pg (ref 26.6–33.0)
MCHC: 32.2 g/dL (ref 31.5–35.7)
MCV: 93 fL (ref 79–97)
Platelets: 271 x10E3/uL (ref 150–450)
RBC: 4.88 x10E6/uL (ref 4.14–5.80)
RDW: 13.1 % (ref 11.6–15.4)
WBC: 7.6 x10E3/uL (ref 3.4–10.8)

## 2023-09-04 LAB — COMPREHENSIVE METABOLIC PANEL WITH GFR
ALT: 21 IU/L (ref 0–44)
AST: 22 IU/L (ref 0–40)
Albumin: 4.3 g/dL (ref 3.8–4.8)
Alkaline Phosphatase: 93 IU/L (ref 44–121)
BUN/Creatinine Ratio: 19 (ref 10–24)
BUN: 19 mg/dL (ref 8–27)
Bilirubin Total: 0.3 mg/dL (ref 0.0–1.2)
CO2: 22 mmol/L (ref 20–29)
Calcium: 9.9 mg/dL (ref 8.6–10.2)
Chloride: 100 mmol/L (ref 96–106)
Creatinine, Ser: 0.99 mg/dL (ref 0.76–1.27)
Globulin, Total: 2.7 g/dL (ref 1.5–4.5)
Glucose: 107 mg/dL — ABNORMAL HIGH (ref 70–99)
Potassium: 4.4 mmol/L (ref 3.5–5.2)
Sodium: 139 mmol/L (ref 134–144)
Total Protein: 7 g/dL (ref 6.0–8.5)
eGFR: 79 mL/min/1.73 (ref >59)

## 2023-09-07 ENCOUNTER — Ambulatory Visit: Payer: Self-pay | Admitting: Physician Assistant

## 2023-10-04 NOTE — Progress Notes (Addendum)
 "  Advanced Heart Failure Clinic Note   Referring Physician: 07/25 admission PCP: Cody Bette Hover, MD Cardiologist: Cody Clay, MD   Chief Complaint: HF visit   HPI:  Cody Herrera is a 78 y/o male with a history of hypertension, hyperlipidemia, CAD, STEMI (07/25) & HfrEF.  Admitted 08/17/23 with chest pain, nausea, and vomiting. EKG showed anterior STEMI and patient was taken emergently to cath lab, however no culprit lesion was identified and initially Takotsubo was suspected. Ostial LAD and first diagonal areas noted to be hazy. This along with EKG and echocardiogram results are consistent with anterior STEMI with recannulization per Cardiology. On admission, BNP was 35.5, HS-troponin was 13,252 > 23,394, and lactic acid was 4.4. No chest x-ray noted. Repeat LHC performed 08/19/23 and confirmed no obstruction. Echocardiogram 08/17/23: LVEF of 30-35%, severe LV dilation, mild asymmetric hypertrophy of the basal/septal segments.   He presents today with a chief complaint of a HF follow-up visit. Currently denies any symptoms, specifically no shortness of breath, chest pain, dizziness or fatigue. Push mowing his yard without issues. Right knee brace due to having bone on bone but denies daily pain.   Used up hospital meds and went back on pre-hospital meds. Says that he won't go back on anything unless his insurance will pay for it. Voices concern that all these doctor visits are just an insurance scam to get money.   ROS: All systems negative except what is listed in HPI, PMH and Problem List  Past Medical History:  Diagnosis Date   Actinic keratosis    Dysplastic nevus 07/05/2020   left mid back, severe atypia, close to margin, clear 10/01/22   Hypercholesteremia    Hyperlipidemia    Hypertension     Current Outpatient Medications  Medication Sig Dispense Refill   aspirin  EC 81 MG tablet Take 1 tablet (81 mg total) by mouth daily. Swallow whole. 30 tablet 12    cyanocobalamin  1000 MCG tablet Take 1 tablet (1,000 mcg total) by mouth daily. 30 tablet 2   dapagliflozin  propanediol (FARXIGA ) 10 MG TABS tablet Take 1 tablet (10 mg total) by mouth daily. 30 tablet 0   metoprolol  succinate (TOPROL -XL) 50 MG 24 hr tablet Take 1 tablet (50 mg total) by mouth daily. Take with or immediately following a meal. 30 tablet 0   nitroGLYCERIN  (NITROSTAT ) 0.4 MG SL tablet Place 1 tablet (0.4 mg total) under the tongue every 5 (five) minutes x 3 doses as needed for chest pain. 25 tablet 12   rosuvastatin  (CRESTOR ) 40 MG tablet Take 40 mg by mouth daily.     sacubitril -valsartan  (ENTRESTO ) 49-51 MG Take 1 tablet by mouth 2 (two) times daily. 60 tablet 0   spironolactone  (ALDACTONE ) 25 MG tablet Take 1 tablet (25 mg total) by mouth daily. 30 tablet 0   No current facility-administered medications for this visit.    Allergies  Allergen Reactions   Prednisone  Dermatitis    Facial redness      Social History   Socioeconomic History   Marital status: Single    Spouse name: Not on file   Number of children: 2   Years of education: Not on file   Highest education level: Not on file  Occupational History   Not on file  Tobacco Use   Smoking status: Never   Smokeless tobacco: Never  Substance and Sexual Activity   Alcohol use: Yes    Comment: ocassinally   Drug use: Never   Sexual activity: Not on file  Other Topics Concern   Not on file  Social History Narrative   Not on file   Social Drivers of Health   Financial Resource Strain: Not on file  Food Insecurity: No Food Insecurity (08/19/2023)   Hunger Vital Sign    Worried About Running Out of Food in the Last Year: Never true    Ran Out of Food in the Last Year: Never true  Transportation Needs: No Transportation Needs (08/19/2023)   PRAPARE - Administrator, Civil Service (Medical): No    Lack of Transportation (Non-Medical): No  Physical Activity: Not on file  Stress: Not on file  Social  Connections: Moderately Integrated (08/17/2023)   Social Connection and Isolation Panel    Frequency of Communication with Friends and Family: More than three times a week    Frequency of Social Gatherings with Friends and Family: More than three times a week    Attends Religious Services: More than 4 times per year    Active Member of [employer] or Organizations: Yes    Attends Banker Meetings: More than 4 times per year    Marital Status: Divorced  Intimate Partner Violence: Not At Risk (08/17/2023)   Humiliation, Afraid, Rape, and Kick questionnaire    Fear of Current or Ex-Partner: No    Emotionally Abused: No    Physically Abused: No    Sexually Abused: No      Family History  Problem Relation Age of Onset   Heart attack Cody Herrera    Heart attack Cody Herrera    Vitals:   10/05/23 0850  BP: (!) 141/81  Pulse: 65  SpO2: 98%  Weight: 197 lb 9.6 oz (89.6 kg)   Wt Readings from Last 3 Encounters:  10/05/23 197 lb 9.6 oz (89.6 kg)  09/03/23 189 lb 12.8 oz (86.1 kg)  08/24/23 192 lb 4 oz (87.2 kg)   Lab Results  Component Value Date   CREATININE 0.99 09/03/2023   CREATININE 0.86 08/24/2023   CREATININE 0.47 (L) 08/20/2023   PHYSICAL EXAM:  General: Well appearing.  Cor: No JVD. Regular rhythm, rate.  Lungs: clear Abdomen: soft, nontender, nondistended. Extremities: trace pitting edema bilateral lowers legs to mid shin Neuro:. Affect pleasant   ECG: not done   ASSESSMENT & PLAN:  1: NICM with reduced ejection fraction- - STEMI 07/25 - LHC 08/17/23 Ost LAD to Prox LAD lesion is 20% stenosed with 40% stenosed side branch in 1st Diag. - repeat LHC 08/19/23 showed nonobstructive CAD - NYHA class I - euvolemic - has scales but says that he has no intention of weighing daily - weight up 5 pounds from last visit here 1 month ago. Says that his appetite has increased - Echo 08/17/23: LVEF of 30-35%, severe LV dilation, mild asymmetric hypertrophy of the basal/septal  segments.  - No longer taking farxiga , metoprolol , entresto  or spironolactone  because he ran out of them. Once that happened, he went back to taking lisinopril  20mg  daily and atenolol  100mg  daily.  - He asks for medication list so that he can call his insurance company and see if the meds will be covered. He is not interested in changing anything today and says that he only plans to change his BP meds if they're covered. Reviewed GDMT and why all 4 pillars are needed but he declines today - BNP 08/17/23 was 35.5  2: HTN- - BP 141/81 - saw PCP (Olmedo) 08/24 - BMET 09/03/23 reviewed: sodium 139, potassium 4.4, creatinine 0.99 &  GFR 79  3: CAD- - LHC 08/17/23:    Ost LAD to Prox LAD lesion is 20% stenosed with 40% stenosed side branch in 1st Diag.   Previously placed Prox LAD to Mid LAD stent is 5% stenosed.   Previously placed Prox RCA DES stent is  widely patent.   The left ventricular ejection fraction is 35-45% by visual estimate.  Apical ballooning/hypokinesis, with basal hypokinesis   There is no aortic valve stenosis. - Repeat LHC 08/19/23 and confirmed no obstruction - saw cardiology Concepcion) 07/25  4: Hyperlipidemia- - LDL 08/17/23 was 45 - lipo (a) 08/18/23 was 49.3 - continue rosuvastatin  40mg  daily   Explained the importance of maintaining contact with cardiology especially if he will be agreeable to med changes once he checks with his pharmacy. Offered to make f/u appt here but he declines but he was told that he could return at any point if needed. He verbalized understanding.    Ellouise DELENA Class, FNP 10/04/23   Late addendum: Patient sent a message asking that the statement about him not returning to the HF Clinic or seeing other doctor's removed as he says that he did not say that. This statement has been removed.    Ellouise DELENA Class FNP 03/07/24   "

## 2023-10-05 ENCOUNTER — Encounter: Payer: Self-pay | Admitting: Family

## 2023-10-05 ENCOUNTER — Ambulatory Visit: Attending: Family | Admitting: Family

## 2023-10-05 VITALS — BP 141/81 | HR 65 | Wt 197.6 lb

## 2023-10-05 DIAGNOSIS — I2510 Atherosclerotic heart disease of native coronary artery without angina pectoris: Secondary | ICD-10-CM | POA: Insufficient documentation

## 2023-10-05 DIAGNOSIS — I5022 Chronic systolic (congestive) heart failure: Secondary | ICD-10-CM | POA: Diagnosis present

## 2023-10-05 DIAGNOSIS — I5020 Unspecified systolic (congestive) heart failure: Secondary | ICD-10-CM

## 2023-10-05 DIAGNOSIS — I110 Hypertensive heart disease with heart failure: Secondary | ICD-10-CM | POA: Insufficient documentation

## 2023-10-05 DIAGNOSIS — I252 Old myocardial infarction: Secondary | ICD-10-CM | POA: Diagnosis not present

## 2023-10-05 DIAGNOSIS — Z955 Presence of coronary angioplasty implant and graft: Secondary | ICD-10-CM | POA: Insufficient documentation

## 2023-10-05 DIAGNOSIS — I10 Essential (primary) hypertension: Secondary | ICD-10-CM

## 2023-10-05 DIAGNOSIS — E785 Hyperlipidemia, unspecified: Secondary | ICD-10-CM | POA: Insufficient documentation

## 2023-10-05 DIAGNOSIS — E782 Mixed hyperlipidemia: Secondary | ICD-10-CM

## 2023-10-05 DIAGNOSIS — I428 Other cardiomyopathies: Secondary | ICD-10-CM | POA: Diagnosis not present

## 2023-10-05 DIAGNOSIS — Z79899 Other long term (current) drug therapy: Secondary | ICD-10-CM | POA: Diagnosis not present

## 2023-10-05 NOTE — Patient Instructions (Signed)
 It was good to see you today! Follow closely with cardiology and let us  know if you need us  for anything.

## 2023-10-14 ENCOUNTER — Ambulatory Visit: Admitting: Physician Assistant

## 2023-10-15 ENCOUNTER — Ambulatory Visit: Payer: Medicare HMO | Admitting: Dermatology

## 2023-11-12 ENCOUNTER — Ambulatory Visit: Admitting: Cardiology

## 2024-03-04 ENCOUNTER — Encounter: Payer: Self-pay | Admitting: Family

## 2024-03-04 ENCOUNTER — Encounter: Payer: Self-pay | Admitting: Cardiology
